# Patient Record
Sex: Male | Born: 1955 | Race: White | Hispanic: No | Marital: Married | Smoking: Never smoker
Health system: Southern US, Community
[De-identification: ages and names within clinical notes are randomized; demographics above are authoritative.]

## PROBLEM LIST (undated history)

## (undated) DIAGNOSIS — G4700 Insomnia, unspecified: Secondary | ICD-10-CM

## (undated) DIAGNOSIS — K635 Polyp of colon: Secondary | ICD-10-CM

## (undated) DIAGNOSIS — G4730 Sleep apnea, unspecified: Secondary | ICD-10-CM

## (undated) DIAGNOSIS — F329 Major depressive disorder, single episode, unspecified: Secondary | ICD-10-CM

## (undated) DIAGNOSIS — M1990 Unspecified osteoarthritis, unspecified site: Secondary | ICD-10-CM

## (undated) DIAGNOSIS — I10 Essential (primary) hypertension: Secondary | ICD-10-CM

## (undated) DIAGNOSIS — F32A Depression, unspecified: Secondary | ICD-10-CM

## (undated) HISTORY — DX: Insomnia, unspecified: G47.00

## (undated) HISTORY — DX: Sleep apnea, unspecified: G47.30

## (undated) HISTORY — PX: ROTATOR CUFF REPAIR: SHX139

## (undated) HISTORY — PX: EYE SURGERY: SHX253

## (undated) HISTORY — DX: Polyp of colon: K63.5

## (undated) HISTORY — DX: Depression, unspecified: F32.A

## (undated) HISTORY — PX: REPLACEMENT TOTAL KNEE: SUR1224

## (undated) HISTORY — DX: Major depressive disorder, single episode, unspecified: F32.9

## (undated) HISTORY — DX: Essential (primary) hypertension: I10

## (undated) HISTORY — PX: WISDOM TOOTH EXTRACTION: SHX21

---

## 2004-09-26 ENCOUNTER — Ambulatory Visit: Payer: Self-pay | Admitting: Family Medicine

## 2004-10-15 ENCOUNTER — Ambulatory Visit: Payer: Self-pay | Admitting: Family Medicine

## 2005-04-02 ENCOUNTER — Ambulatory Visit: Payer: Self-pay | Admitting: Internal Medicine

## 2005-09-08 ENCOUNTER — Ambulatory Visit: Payer: Self-pay | Admitting: Family Medicine

## 2005-10-02 ENCOUNTER — Ambulatory Visit: Payer: Self-pay | Admitting: Family Medicine

## 2006-03-04 ENCOUNTER — Ambulatory Visit: Payer: Self-pay | Admitting: Family Medicine

## 2006-03-16 ENCOUNTER — Ambulatory Visit: Payer: Self-pay | Admitting: Family Medicine

## 2006-06-12 ENCOUNTER — Ambulatory Visit: Payer: Self-pay | Admitting: Family Medicine

## 2006-09-16 ENCOUNTER — Ambulatory Visit: Payer: Self-pay | Admitting: Family Medicine

## 2007-02-23 ENCOUNTER — Ambulatory Visit: Payer: Self-pay | Admitting: Family Medicine

## 2007-03-01 ENCOUNTER — Encounter: Admission: RE | Admit: 2007-03-01 | Discharge: 2007-04-14 | Payer: Self-pay | Admitting: Family Medicine

## 2007-04-27 ENCOUNTER — Telehealth (INDEPENDENT_AMBULATORY_CARE_PROVIDER_SITE_OTHER): Payer: Self-pay | Admitting: *Deleted

## 2007-05-04 ENCOUNTER — Encounter: Admission: RE | Admit: 2007-05-04 | Discharge: 2007-06-03 | Payer: Self-pay | Admitting: Family Medicine

## 2007-05-04 ENCOUNTER — Encounter: Payer: Self-pay | Admitting: Family Medicine

## 2007-05-16 ENCOUNTER — Telehealth: Payer: Self-pay | Admitting: Family Medicine

## 2007-06-03 ENCOUNTER — Encounter: Payer: Self-pay | Admitting: Family Medicine

## 2007-08-16 ENCOUNTER — Ambulatory Visit: Payer: Self-pay | Admitting: Family Medicine

## 2007-08-16 DIAGNOSIS — F329 Major depressive disorder, single episode, unspecified: Secondary | ICD-10-CM | POA: Insufficient documentation

## 2007-08-16 DIAGNOSIS — D649 Anemia, unspecified: Secondary | ICD-10-CM | POA: Insufficient documentation

## 2007-08-16 DIAGNOSIS — F32A Depression, unspecified: Secondary | ICD-10-CM | POA: Insufficient documentation

## 2007-10-31 ENCOUNTER — Inpatient Hospital Stay (HOSPITAL_COMMUNITY): Admission: RE | Admit: 2007-10-31 | Discharge: 2007-11-03 | Payer: Self-pay | Admitting: Orthopedic Surgery

## 2007-11-03 ENCOUNTER — Encounter: Payer: Self-pay | Admitting: Family Medicine

## 2007-12-15 ENCOUNTER — Ambulatory Visit: Payer: Self-pay | Admitting: Family Medicine

## 2007-12-15 DIAGNOSIS — G4700 Insomnia, unspecified: Secondary | ICD-10-CM | POA: Insufficient documentation

## 2007-12-15 DIAGNOSIS — F411 Generalized anxiety disorder: Secondary | ICD-10-CM | POA: Insufficient documentation

## 2008-08-01 ENCOUNTER — Telehealth: Payer: Self-pay | Admitting: Family Medicine

## 2009-01-25 ENCOUNTER — Ambulatory Visit: Payer: Self-pay | Admitting: Family Medicine

## 2009-01-29 LAB — CONVERTED CEMR LAB
ALT: 17 units/L (ref 0–53)
Albumin: 4.3 g/dL (ref 3.5–5.2)
Alkaline Phosphatase: 45 units/L (ref 39–117)
BUN: 20 mg/dL (ref 6–23)
Bilirubin, Direct: 0 mg/dL (ref 0.0–0.3)
Calcium: 9.2 mg/dL (ref 8.4–10.5)
Eosinophils Absolute: 0.2 10*3/uL (ref 0.0–0.7)
GFR calc non Af Amer: 74.89 mL/min (ref >60)
Glucose, Bld: 181 mg/dL — ABNORMAL HIGH (ref 70–99)
HCT: 42.6 % (ref 39.0–52.0)
Lymphocytes Relative: 31.7 % (ref 12.0–46.0)
Lymphs Abs: 1.8 10*3/uL (ref 0.7–4.0)
Monocytes Relative: 8.5 % (ref 3.0–12.0)
Platelets: 207 10*3/uL (ref 150.0–400.0)
RDW: 12.4 % (ref 11.5–14.6)
Testosterone: 314.91 ng/dL — ABNORMAL LOW (ref 350.00–890.00)
Total Protein: 6.9 g/dL (ref 6.0–8.3)

## 2009-03-21 ENCOUNTER — Encounter (INDEPENDENT_AMBULATORY_CARE_PROVIDER_SITE_OTHER): Payer: Self-pay | Admitting: *Deleted

## 2009-04-12 ENCOUNTER — Telehealth: Payer: Self-pay | Admitting: Family Medicine

## 2009-06-26 ENCOUNTER — Telehealth: Payer: Self-pay | Admitting: Family Medicine

## 2009-06-27 ENCOUNTER — Encounter: Payer: Self-pay | Admitting: Family Medicine

## 2009-09-20 ENCOUNTER — Ambulatory Visit: Payer: Self-pay | Admitting: Family Medicine

## 2009-10-08 ENCOUNTER — Ambulatory Visit: Payer: Self-pay | Admitting: Family Medicine

## 2009-10-28 ENCOUNTER — Encounter (INDEPENDENT_AMBULATORY_CARE_PROVIDER_SITE_OTHER): Payer: Self-pay | Admitting: *Deleted

## 2009-10-29 ENCOUNTER — Ambulatory Visit: Payer: Self-pay | Admitting: Internal Medicine

## 2009-12-20 ENCOUNTER — Ambulatory Visit: Payer: Self-pay | Admitting: Family Medicine

## 2009-12-23 ENCOUNTER — Telehealth: Payer: Self-pay | Admitting: Family Medicine

## 2010-01-01 ENCOUNTER — Telehealth: Payer: Self-pay | Admitting: Family Medicine

## 2010-01-07 ENCOUNTER — Telehealth: Payer: Self-pay | Admitting: Family Medicine

## 2010-03-07 ENCOUNTER — Telehealth: Payer: Self-pay | Admitting: Family Medicine

## 2010-09-27 ENCOUNTER — Encounter: Payer: Self-pay | Admitting: Family Medicine

## 2010-10-07 NOTE — Progress Notes (Signed)
Summary: phone  Phone Note Outgoing Call   Call placed by: Raechel Ache, RN,  December 23, 2009 8:46 AM Summary of Call: [facility]  Follow-up for Phone Call        Phone call completed Follow-up by: Raechel Ache, RN,  December 24, 2009 9:18 AM

## 2010-10-07 NOTE — Progress Notes (Signed)
Summary: REQ FOR REFILL  Phone Note Refill Request Message from:  Fax from Pharmacy on March 07, 2010 10:06 AM  Refills Requested: Medication #1:  WELLBUTRIN XL 150 MG XR24H-TAB once daily   Notes: [facility] - [address]. [place].    Initial call taken by: Debbra Riding,  March 07, 2010 10:07 AM    Prescriptions: WELLBUTRIN XL 150 MG XR24H-TAB (BUPROPION HCL) once daily  #30 x 11   Entered by:   Raechel Ache, RN   Authorized by:   Nelwyn Salisbury MD   Signed by:   Raechel Ache, RN on 03/07/2010   Method used:   Electronically to        [facility] (retail)       [address]       [place], [place]  [address]       Ph: [phone] or [phone]       Fax: [phone]   RxID:   [id number]

## 2010-10-07 NOTE — Assessment & Plan Note (Signed)
Summary: STUFFINESS GREEN MUCUS SEPTIC/PS   Vital Signs:  Patient profile:   55 year old male Weight:      185 pounds Temp:     98.5 degrees F oral Pulse rate:   87 / minute BP sitting:   134 / 82  (left arm) Cuff size:   regular  Vitals Entered By: Alfred Levins, CMA (October 08, 2009 4:15 PM) CC: chest congestion, cough x3 wks   History of Present Illness: Here for one week of stuffy head, PND, chesyt congestion, and coughing up yellow mucus. No fever. On Mucinex.  Allergies: No Known Drug Allergies  Past History:  Past Medical History: Reviewed history from 12/15/2007 and no changes required. Anemia-NOS Depression Anxiety  Review of Systems  The patient denies anorexia, fever, weight loss, weight gain, vision loss, decreased hearing, hoarseness, chest pain, syncope, dyspnea on exertion, peripheral edema, headaches, hemoptysis, abdominal pain, melena, hematochezia, severe indigestion/heartburn, hematuria, incontinence, genital sores, muscle weakness, suspicious skin lesions, transient blindness, difficulty walking, depression, unusual weight change, abnormal bleeding, enlarged lymph nodes, angioedema, breast masses, and testicular masses.    Physical Exam  General:  Well-developed,well-nourished,in no acute distress; alert,appropriate and cooperative throughout examination Head:  Normocephalic and atraumatic without obvious abnormalities. No apparent alopecia or balding. Eyes:  No corneal or conjunctival inflammation noted. EOMI. Perrla. Funduscopic exam benign, without hemorrhages, exudates or papilledema. Vision grossly normal. Ears:  External ear exam shows no significant lesions or deformities.  Otoscopic examination reveals clear canals, tympanic membranes are intact bilaterally without bulging, retraction, inflammation or discharge. Hearing is grossly normal bilaterally. Nose:  External nasal examination shows no deformity or inflammation. Nasal mucosa are pink and  moist without lesions or exudates. Mouth:  Oral mucosa and oropharynx without lesions or exudates.  Teeth in good repair. Neck:  No deformities, masses, or tenderness noted. Lungs:  soft rhonchi   Impression & Recommendations:  Problem # 1:  ACUTE BRONCHITIS (ICD-466.0)  His updated medication list for this problem includes:    Zithromax Z-pak 250 Mg Tabs (Azithromycin) .Marland Kitchen... As directed  Complete Medication List: 1)  Multivitamins Tabs (Multiple vitamin) .Marland Kitchen.. 1 by mouth once daily 2)  Mobic 15 Mg Tabs (Meloxicam) .Marland Kitchen.. 1 by mouth once daily 3)  Ativan 0.5 Mg Tabs (Lorazepam) .Marland Kitchen.. 1 or 2 every 6 hours as needed anxiety 4)  Temazepam 30 Mg Caps (Temazepam) .Marland Kitchen.. 1 at bedtime 5)  Wellbutrin Xl 150 Mg Xr24h-tab (Bupropion hcl) .... Once daily 6)  Androgel Pump 1 % Gel (Testosterone) .Marland Kitchen.. 10g every day 7)  Zithromax Z-pak 250 Mg Tabs (Azithromycin) .... As directed  Patient Instructions: 1)  Please schedule a follow-up appointment as needed .  Prescriptions: ZITHROMAX Z-PAK 250 MG TABS (AZITHROMYCIN) as directed  #1 x 0   Entered and Authorized by:   Nelwyn Salisbury MD   Signed by:   Nelwyn Salisbury MD on 10/08/2009   Method used:   Electronically to        [facility] (retail)       [address]       [place], [place]  [address]       Ph: [phone] or [phone]       Fax: [phone]   RxID:   [id number]

## 2010-10-07 NOTE — Letter (Signed)
Summary: [facility] Instructions  [facility]  [address] [place], [place] [address]   Phone: [phone]  Fax: [phone]       PIO EATHERLY    1955-11-05    MRN: [id number]        Procedure Day /Date: Tuesday 11/12/2009     Arrival Time: 10:00 am      Procedure Time: 11:00 am     Location of Procedure:                    _x _  [facility] (4th Floor)                         PREPARATION FOR COLONOSCOPY WITH MOVIPREP   Starting 5 days prior to your procedure Thursday 3/3 do not eat nuts, seeds, popcorn, corn, beans, peas,  salads, or any raw vegetables.  Do not take any fiber supplements (e.g. Metamucil, Citrucel, and Benefiber).  THE DAY BEFORE YOUR PROCEDURE         DATE: Monday 3/7  1.  Drink clear liquids the entire day-NO SOLID FOOD  2.  Do not drink anything colored red or purple.  Avoid juices with pulp.  No orange juice.  3.  Drink at least 64 oz. (8 glasses) of fluid/clear liquids during the day to prevent dehydration and help the prep work efficiently.  CLEAR LIQUIDS INCLUDE: Water Jello Ice Popsicles Tea (sugar ok, no milk/cream) Powdered fruit flavored drinks Coffee (sugar ok, no milk/cream) Gatorade Juice: apple, white grape, white cranberry  Lemonade Clear bullion, consomm, broth Carbonated beverages (any kind) Strained chicken noodle soup Hard Candy                             4.  In the morning, mix first dose of MoviPrep solution:    Empty 1 Pouch A and 1 Pouch B into the disposable container    Add lukewarm drinking water to the top line of the container. Mix to dissolve    Refrigerate (mixed solution should be used within 24 hrs)  5.  Begin drinking the prep at 5:00 p.m. The MoviPrep container is divided by 4 marks.   Every 15 minutes drink the solution down to the next mark (approximately 8 oz) until the full liter is complete.   6.  Follow completed prep with 16 oz of clear liquid of your choice (Nothing  red or purple).  Continue to drink clear liquids until bedtime.  7.  Before going to bed, mix second dose of MoviPrep solution:    Empty 1 Pouch A and 1 Pouch B into the disposable container    Add lukewarm drinking water to the top line of the container. Mix to dissolve    Refrigerate  THE DAY OF YOUR PROCEDURE      DATE: Tuesday 3/8  Beginning at 6:00 am (5 hours before procedure):         1. Every 15 minutes, drink the solution down to the next mark (approx 8 oz) until the full liter is complete.  2. Follow completed prep with 16 oz. of clear liquid of your choice.    3. You may drink clear liquids until 9:00 am (2 HOURS BEFORE PROCEDURE).   MEDICATION INSTRUCTIONS  Unless otherwise instructed, you should take regular prescription medications with a small sip of water   as early as possible the morning of  your procedure.        OTHER INSTRUCTIONS  You will need a responsible adult at least 54 years of age to accompany you and drive you home.   This person must remain in the waiting room during your procedure.  Wear loose fitting clothing that is easily removed.  Leave jewelry and other valuables at home.  However, you may wish to bring a book to read or  an iPod/MP3 player to listen to music as you wait for your procedure to start.  Remove all body piercing jewelry and leave at home.  Total time from sign-in until discharge is approximately 2-3 hours.  You should go home directly after your procedure and rest.  You can resume normal activities the  day after your procedure.  The day of your procedure you should not:   Drive   Make legal decisions   Operate machinery   Drink alcohol   Return to work  You will receive specific instructions about eating, activities and medications before you leave.    The above instructions have been reviewed and explained to me by   Ezra Sites RN  October 29, 2009 4:02 PM   I fully understand and can verbalize  these instructions _____________________________ Date _________

## 2010-10-07 NOTE — Miscellaneous (Signed)
Summary: [facility]  Clinical Lists Changes  Medications: Added new medication of MOVIPREP 100 GM  SOLR (PEG-KCL-NACL-NASULF-NA ASC-C) As per prep instructions. - Signed Rx of MOVIPREP 100 GM  SOLR (PEG-KCL-NACL-NASULF-NA ASC-C) As per prep instructions.;  #1 x 0;  Signed;  Entered by: Ezra Sites RN;  Authorized by: Hilarie Fredrickson MD;  Method used: Electronically to [facility]. [facility] [address], [place], [place], [place]  [address], Ph: [phone] or [phone], Fax: [phone] Observations: Added new observation of NKA: T (10/29/2009 15:39)    Prescriptions: MOVIPREP 100 GM  SOLR (PEG-KCL-NACL-NASULF-NA ASC-C) As per prep instructions.  #1 x 0   Entered by:   Ezra Sites RN   Authorized by:   Hilarie Fredrickson MD   Signed by:   Ezra Sites RN on 10/29/2009   Method used:   Electronically to        [facility] (retail)       [address]       [place], [place]  [address]       Ph: [phone] or [phone]       Fax: [phone]   RxID:   [id number]

## 2010-10-07 NOTE — Assessment & Plan Note (Signed)
Summary: FLU-SHOT // RS  Nurse Visit   Allergies: No Known Drug Allergies  Orders Added: 1)  Admin 1st Vaccine [90471] 2)  Flu Vaccine 67yrs + [16606] Flu Vaccine Consent Questions     Do you have a history of severe allergic reactions to this vaccine? no    Any prior history of allergic reactions to egg and/or gelatin? no    Do you have a sensitivity to the preservative Thimersol? no    Do you have a past history of Guillan-Barre Syndrome? no    Do you currently have an acute febrile illness? no    Have you ever had a severe reaction to latex? no    Vaccine information given and explained to patient? yes    Are you currently pregnant? no    Lot Number:[id number]   Exp Date:03/06/2010   Site Given  Left Deltoid IM   .lbflu

## 2010-10-07 NOTE — Progress Notes (Signed)
Summary: new rx  Phone Note Call from Patient Call back at Home Phone [phone]   Caller: Patient Call For: Christopher Salisbury MD Summary of Call: pt needs new rx to [facility]  lorazapam 0.5mg  Initial call taken by: Heron Sabins,  Jan 07, 2010 9:18 AM  Follow-up for Phone Call        call in #60 with 2 rf Follow-up by: Christopher Salisbury MD,  Jan 07, 2010 1:19 PM    Prescriptions: ATIVAN 0.5 MG  TABS (LORAZEPAM) 1 or 2 every 6 hours as needed anxiety  #60 x 2   Entered by:   Raechel Ache, RN   Authorized by:   Christopher Salisbury MD   Signed by:   Raechel Ache, RN on 01/07/2010   Method used:   Historical   RxID:   [id number]

## 2010-10-07 NOTE — Progress Notes (Signed)
Summary: refill Temazepam  Phone Note Refill Request Message from:  Fax from Pharmacy on January 01, 2010 12:58 PM  Refills Requested: Medication #1:  TEMAZEPAM 30 MG  CAPS 1 at bedtime   Dosage confirmed as above?Dosage Confirmed   Supply Requested: 1 month   Last Refilled: 09/12/2009  Method Requested: Telephone to Pharmacy Initial call taken by: Raechel Ache, RN,  January 01, 2010 12:59 PM [facility]er: [facility]  Follow-up for Phone Call        call in #30 with 5 rf Follow-up by: Nelwyn Salisbury MD,  January 01, 2010 6:17 PM    Prescriptions: TEMAZEPAM 30 MG  CAPS (TEMAZEPAM) 1 at bedtime  #30 x 5   Entered by:   Raechel Ache, RN   Authorized by:   Nelwyn Salisbury MD   Signed by:   Raechel Ache, RN on 01/02/2010   Method used:   Historical   RxID:   [id number]

## 2011-01-20 NOTE — Op Note (Signed)
NAME:  Christopher [name], Christopher [name]                 ACCOUNT NO.:  [id number]   MEDICAL RECORD NO.:  [id number]          PATIENT TYPE:  INP   LOCATION:  1621                         FACILITY:  [facility]   PHYSICIAN:  Ollen Gross, M.D.    DATE OF BIRTH:  Jan 21, 1956   DATE OF PROCEDURE:  10/31/2007  DATE OF DISCHARGE:                               OPERATIVE REPORT   PREOPERATIVE DIAGNOSIS:  Osteoarthritis right knee.   POSTOPERATIVE DIAGNOSIS:  Osteoarthritis right knee.   PROCEDURE:  Right total knee arthroplasty.   SURGEON:  Ollen Gross, M.D.   ASSISTANT:  Alexzandrew L. Perkins, P.A.C.   ANESTHESIA:  Spinal with Duramorph.   ESTIMATED BLOOD LOSS:  Minimal.   DRAINS:  None.   TOURNIQUET TIME:  39 minutes at 300 mmHg.   COMPLICATIONS:  None.   DISPOSITION:  Stable to recovery room.   INDICATIONS FOR PROCEDURE:  The patient is a 55 year old male with end-  stage medial compartment arthritis of the right knee with progressively  worsening pain and dysfunction.  He has failed nonoperative management  and presents for total knee arthroplasty.   DESCRIPTION OF PROCEDURE:  After successful administration of spinal  anesthetic, a tourniquet was placed high on the right thigh and right  lower extremity prepped and draped in the usual sterile fashion.  Extremity was wrapped with Esmarch, knee flexed, and tourniquet inflated  to 300 mmHg.  A midline incision was made with a 10 blade through the  subcutaneous tissue to the level of the extensor mechanism.  A fresh  blade was used to make a medial parapatellar arthrotomy.  Soft tissue  over the proximal medial tibia is subperiosteally elevated to the joint  line with a knife but into the semimembranosus bursa with the Cobb  elevator.  Soft tissue laterally was elevated with attention being paid  to avoid the patellar tendon or the tibial tubercle.  The patella was  subluxed laterally and the knee flexed 90 degrees, ACL and PCL removed.  Drill was  used to create a starting hole in the distal femur and the  canal was thoroughly irrigated.  The 5 degree right valgus alignment  guide was placed at the posterior condyle, rotations marked in a black  pen to remove 10 mm off the distal femur.  Distal femoral resection is  made with an oscillating saw.  Sizing block was placed and size 4 was  the most appropriate.  Rotation is marked off the epicondylar axis.  The  size 4 cutting block was placed and the anterior, posterior, and chamfer  cuts were made.   The tibia subluxed forward and the menisci are removed.  The  extramedullary tibial alignment guide is placed referencing proximally  at the medial aspect of the tibial tubercle and distally along the  second metatarsal axis and tibial crest.  Locks pinned to remove 10 mm  off the nondeficient lateral side.  Tibial resection is made with an  oscillating saw.  Size 4 was the most appropriate tibial component and  the proximal tibia was prepared with modular drill and keel punch for  a  size 4.  Femoral preparation was completed with the anterior condylar  cut.   Size 4 mobile bearing tibial trial, size 4 posterior stabilized femoral  trial, and a 10 mm posterior stabilized rotating platform insert trial  are placed.  With the 10 there was a tiny degree of hyperextension and a  tiny degree of varus and valgus placement.  With the 12.5 which allowed  for full extension with excellent varus and valgus anterior and  posterior balance throughout full range of motion.  The patella was then  everted and thickness measured to be 25 mm.  Freehand resection was  taken to 14 mm, the 38 template was placed, lug holes were drilled,  trial patella is placed and it tracks normally.  Osteophytes are removed  off the posterior femur with the trial in place.  All trials were  removed and the cut bone surfaces are prepared with pulsatile lavage.  Cement is mixed and once ready for implantation, a size 4  mobile bearing  tibial tray, size 4 posterior stabilized femur, and 38 patella are  cemented into place and the patella is held with the clamp.  Trial 12.5  mm insert is placed and the knee held in full extension and all extruded  cement removed.  Once the cement had fully hardened and a trial was  removed and the wound copiously irrigated with saline solution.  FloSeal  was injected into the posterior capsule then the permanent 12.5 mm  posterior stabilized rotating platform insert is placed into the tibial  tray.  FloSeal was then injected in the medial and lateral gutters and  suprapatellar area.  A moist sponge is placed and the tourniquet  released with a total time of 39 minutes.  It was held for two minutes  and then removed.  Minimal bleeding was encountered.  The bleeding which  is encountered is stopped with electrocautery.  The wound was again  copiously irrigated and the extensor mechanism closed with interrupted  #1 PDS.  Flexion against gravity is 140 degrees.  The subcu is closed  with interrupted 2-0 Vicryl.  Subcuticular with running 4-0 Monocryl.  The incision is cleaned and dried, and Steri-Strips and a bulky sterile  dressing applied.  He was then placed into a knee immobilizer, awakened,  and transported to the recovery room in stable condition.      Ollen Gross, M.D.  Electronically Signed     FA/MEDQ  D:  10/31/2007  T:  10/31/2007  Job:  [id number]

## 2011-01-20 NOTE — H&P (Signed)
NAME:  Christopher [name], Christopher [name]                 ACCOUNT NO.:  [id number]   MEDICAL RECORD NO.:  [id number]          PATIENT TYPE:  INP   LOCATION:  NA                           [facility]  [facility]   PHYSICIAN:  Ollen Gross, M.D.    DATE OF BIRTH:  March 26, 1956   DATE OF ADMISSION:  10/31/2007  DATE OF DISCHARGE:                              HISTORY & PHYSICAL   CHIEF COMPLAINT:  Right knee pain.   HISTORY OF PRESENT ILLNESS:  The patient is a 55 year old male who has  seen by Dr. Lequita Halt for ongoing right knee pain.  He was seen in second  opinion in the early portion the year of 2008.  At that time, he was  noted to have some degenerative changes in the medial compartment, and  unfortunately has significant pain and continued problems.  He has  progressive worsening pain and dysfunction.  He has reached the point  where he would like to have something done about it.  Risks and benefits  of the procedure having been discussed, he elects to proceed with  surgery.   ALLERGIES:  NO KNOWN DRUG ALLERGIES.   CURRENT MEDICATIONS:  Mobic.   PAST MEDICAL HISTORY:  Arthritis.   PAST SURGICAL HISTORY:  1. Two meniscal surgeries.  2. Right right rotator cuff repair.  3. Left shoulder labral repair.   FAMILY HISTORY:  Mother with arthritis.   SOCIAL HISTORY:  He is married.  He is an Investment banker, corporate.  Nonsmoker.  Three drinks of wine per week.  Two children.  Wife will be  assisting with care after surgery.   REVIEW OF SYSTEMS:  GENERAL:  No fevers, chills, or night sweats.  NEUROLOGIC:  No seizures, syncope, or paralysis.  RESPIRATORY:  No  shortness of breath, productive cough, or hemoptysis.  CARDIOVASCULAR:  No chest pain, angina, orthopnea.  GI:  No nausea, vomiting, diarrhea,  or constipation.  GU:  No dysuria, hematuria, or discharge.  MUSCULOSKELETAL:  Right knee.   PHYSICAL EXAMINATION:  VITAL SIGNS:  Pulse 64, respirations 12, blood  pressure 120/68.  GENERAL:  A 55 year old white  male well-nourished, well-developed, no  acute distress.  Alert, oriented, and cooperative.  Good historian.  HEENT: Normocephalic, atraumatic.  Pupils round and reactive.  EOMs  intact.  NECK:  Supple.  No bruits.  CHEST:  Clear anterior posterior chest walls.  No rhonchi, rales,  wheezing.  HEART:  Regular rhythm.  No murmur.  S1 and S2 noted.  ABDOMEN:  Soft, flat, nontender.  Bowel sounds present.  RECTAL/BREASTS/GENITALIA:  Not done, not pertinent to present illness.  EXTREMITIES:  Right knee no effusion.  Range of motion from 0 to 110.  Tender more medial than lateral.   IMPRESSION:  Osteoarthritis, right knee.   PLAN:  The patient admitted to [facility] to undergo a right  total knee replacement arthroplasty.  Surgery will be performed by Ollen Gross.      Alexzandrew L. Perkins, P.A.C.      Ollen Gross, M.D.  Electronically Signed    ALP/MEDQ  D:  10/30/2007  T:  10/31/2007  Job:  [id number]   cc:   Ollen Gross, M.D.  Fax: [phone]   Tera Mater. Clent Ridges, MD  [address] [place]  [place] [address]

## 2011-01-20 NOTE — Discharge Summary (Signed)
NAME:  GERAN, HAITHCOCK                 ACCOUNT NO.:  [id number]   MEDICAL RECORD NO.:  [id number]          PATIENT TYPE:  INP   LOCATION:  1621                         [facility]ACILITY:  [facility]   PHYSICIAN:  Ollen Gross, M.D.    DATE OF BIRTH:  29-Dec-1955   DATE OF ADMISSION:  10/31/2007  DATE OF DISCHARGE:  11/03/2007                               DISCHARGE SUMMARY   ADMITTING DIAGNOSIS:  Osteoarthritis, right knee.   DISCHARGE DIAGNOSES:  1. Osteoarthritis, right knee, status post right total knee      arthroplasty.  2. Mild postoperative hyponatremia, improved.   PROCEDURE:  October 31, 2007 - right total knee.  Surgeon - Dr.  Lequita Halt.  Assistant - Avel Peace PA-C.  Anesthesia spinal with  Duramorph.   CONSULTATIONS:  None.   BRIEF HISTORY:  The patient is a 55 year old male with end-stage  arthritis of the right knee with progressive worsening pain and  dysfunction, failed outpatient management, who now presents for total  knee arthroplasty.   LABORATORY DATA:  Preoperative CBC showed a hemoglobin 14.3, hematocrit  40.6, white cell count 4.9, platelets 229.  Postoperative hemoglobin  11.7; came back up to 12.  Last noted hemoglobin and hematocrit of 10.9  and 31.4.  PT/PTT preoperatively of 13.4 and 28 respectively.  INR 1.0.  Serial pro times followed.  Last noted PT/INR 26.7 and 2.4.  Chemistry  panel on admission - slightly elevated CO2 of 33.  Remaining chem panel  within normal limits.  Serial BMETs were followed.  Sodium did drop from  144 to 134 back up to 139.  Preoperative UA negative.  Blood group type  A+.   EKG on October 21, 2007 revealed marked sinus bradycardia, abnormal  EKG, confirmed but Dr. Lady Deutscher.   HOSPITAL COURSE:  The patient was admitted to [facility] and  tolerated the procedure well.  Later transferred to the recovery room  and the orthopedic floor.  Started on PCA and p.o. analgesic pain  control following surgery.  Had a rough night  on the evening of surgery,  doing a little bit better on the morning of day one.  Started getting up  out of bed with therapy.  Foley was pulled.  By day two, doing a little  bit better.  Pain under decent control and from a therapy standpoint  actually walked about 400 feet, doing excellent with mobility.  Dressing  change; incision looked good.  Continued to progress well and was ready  to go home by the following day.   DISCHARGE/PLAN:  1. Was discharged home on November 03, 2007.  2. Discharge diagnoses - please see above.  3. Discharge medications - Percocet, Robaxin, Coumadin.   ACTIVITY:  Weightbearing as tolerated.  Total knee protocol.  Home  health PT, home health nursing.   DIET:  As tolerated.   FOLLOWUP:  Follow up in 2 weeks.   DISPOSITION:  Home.   CONDITION ON DISCHARGE:  Improving.      Alexzandrew L. Perkins, P.A.C.      Ollen Gross, M.D.  Electronically Signed  ALP/MEDQ  D:  12/12/2007  T:  12/12/2007  Job:  [id number]   cc:   Jeannett Senior A. Clent Ridges, MD  [address] [place]  [place] [address]

## 2011-01-23 NOTE — Assessment & Plan Note (Signed)
[facility]                                 ON-CALL NOTE   NAME:Christopher [name], Christopher [name]                          MRN:          [id number]  DATE:11/05/2007                            DOB:          09/02/1956    TIME OF CALL:  11:35 a.m.   PHONE NUMBER:  [phone] or [phone].   CALLER:  Tedra Coupe.   OBJECTIVE:  No bowel movements since knee replacement surgery on Monday.  He is uncomfortable and complains of rash on his back.  A [facility] nurse  suggested an enema, but it has not helped.  They tried prune juice,  apple juice, and it has not helped either.   ASSESSMENT:  Constipation probably resulting from narcotic medication.   PLAN:  Suggested they try milk of magnesia 30 mL p.o. now.  May repeat  in 4 hours if no production.  To the ER if his symptoms worsen.   PRIMARY CARE [place]:  Dr. Clent Ridges.  Home office is [place].     Arta Silence, MD  Electronically Signed    RNS/MedQ  DD: 11/05/2007  DT: 11/06/2007  Job #: [id number]

## 2011-03-02 ENCOUNTER — Other Ambulatory Visit: Payer: Self-pay | Admitting: Family Medicine

## 2011-03-02 NOTE — Telephone Encounter (Signed)
rx request for temazepam 30 #30 pls advise

## 2011-03-03 NOTE — Telephone Encounter (Signed)
duplicate

## 2011-03-03 NOTE — Telephone Encounter (Signed)
Call in #30 with 5 rf 

## 2011-03-19 ENCOUNTER — Other Ambulatory Visit: Payer: Self-pay | Admitting: Family Medicine

## 2011-03-20 NOTE — Telephone Encounter (Signed)
Looks like pt was last here on 10/08/09 and script last filled on 01/29/11.

## 2011-03-24 ENCOUNTER — Other Ambulatory Visit: Payer: Self-pay

## 2011-03-24 NOTE — Telephone Encounter (Signed)
He needs an OV before we can do any refills

## 2011-03-24 NOTE — Telephone Encounter (Signed)
Last filled 01/29/2011. Last ov/acute 10/08/2009. Last med check 01/25/2009

## 2011-03-25 NOTE — Telephone Encounter (Signed)
Pharmacy notified. Left message for pt to schedule OV

## 2011-03-27 ENCOUNTER — Ambulatory Visit (INDEPENDENT_AMBULATORY_CARE_PROVIDER_SITE_OTHER): Payer: BC Managed Care – PPO | Admitting: Family Medicine

## 2011-03-27 ENCOUNTER — Encounter: Payer: Self-pay | Admitting: Family Medicine

## 2011-03-27 VITALS — BP 122/80 | HR 67 | Temp 98.6°F | Wt 180.0 lb

## 2011-03-27 DIAGNOSIS — E291 Testicular hypofunction: Secondary | ICD-10-CM

## 2011-03-27 MED ORDER — TESTOSTERONE 20.25 MG/ACT (1.62%) TD GEL: 4.0000 | Freq: Every day | TRANSDERMAL | Status: DC

## 2011-03-30 ENCOUNTER — Telehealth: Payer: Self-pay | Admitting: Family Medicine

## 2011-03-30 ENCOUNTER — Encounter: Payer: Self-pay | Admitting: Family Medicine

## 2011-03-30 NOTE — Progress Notes (Signed)
  Subjective:    Patient ID: Christopher [name], male    DOB: 1956/03/09, 55 y.o.   MRN: [id number]  HPI Here to discuss low testosterone. He has been pleased with Androgel and wants to stay on this. He feels better and has better libido.    Review of Systems  Constitutional: Negative.   Respiratory: Negative.   Cardiovascular: Negative.   Gastrointestinal: Negative.   Genitourinary: Negative.        Objective:   Physical Exam  Constitutional: He appears well-developed and well-nourished.  Cardiovascular: Normal rate, regular rhythm, normal heart sounds and intact distal pulses.   Pulmonary/Chest: Effort normal and breath sounds normal.          Assessment & Plan:  Check another level and refills are written

## 2011-03-30 NOTE — Telephone Encounter (Signed)
Pt was written a prescription for Testosterone (ANDROGEL PUMP) 20.25 MG/ACT (1.62%) on Friday. Percentage of meds was changed and pharmacy will not fill, need authorization from insurance.

## 2011-03-30 NOTE — Telephone Encounter (Signed)
Pt. Normally has prescriptions filled through [facility] please change to [facility].

## 2011-03-31 NOTE — Telephone Encounter (Signed)
Pt called to state that insurance is requiring pa for angrogel and it may be due to the dosage change.  If that is the case pt is ok with going back to the old dosage to avoid pa.

## 2011-04-01 ENCOUNTER — Telehealth: Payer: Self-pay | Admitting: Family Medicine

## 2011-04-01 DIAGNOSIS — E291 Testicular hypofunction: Secondary | ICD-10-CM

## 2011-04-01 NOTE — Telephone Encounter (Signed)
Message copied by Baldemar Friday on Wed Apr 01, 2011  8:53 AM ------      Message from: Christopher [name]      Created: Mon Mar 30, 2011  1:19 PM       His level is quite low. Use the new prescription and recheck [name] level in 6 months

## 2011-04-01 NOTE — Telephone Encounter (Signed)
Change his pharmacy to [facility]

## 2011-04-01 NOTE — Telephone Encounter (Signed)
Pharmacy was changed in computer and other issues were covered in last phone note.

## 2011-04-01 NOTE — Telephone Encounter (Signed)
I spoke with pt, gave lab results, put a future lab order in computer and prior authorization for Andro Gel was faxed on 03/31/11.

## 2011-04-03 ENCOUNTER — Telehealth: Payer: Self-pay | Admitting: Family Medicine

## 2011-04-03 NOTE — Telephone Encounter (Signed)
Tell him to go ahead and use 8 pumps a day of the Androgel. Also call in a new rx for 8 pumps a day for one month plus 5 rf

## 2011-04-03 NOTE — Telephone Encounter (Signed)
Left message on machine for patient  To return our call 

## 2011-04-03 NOTE — Telephone Encounter (Signed)
Pt stated that he picked up new prescription and he believes that this dose is not stronger than what he was taking. Please explain?

## 2011-04-06 NOTE — Telephone Encounter (Signed)
Called pharmacy with new directions and refills, left message for pt with new info.

## 2011-04-08 ENCOUNTER — Telehealth: Payer: Self-pay | Admitting: Family Medicine

## 2011-04-08 NOTE — Telephone Encounter (Signed)
Pt is changing from pharmacy in [place] to [facility] and we do have the new one in computer.

## 2011-04-15 ENCOUNTER — Telehealth: Payer: Self-pay | Admitting: Family Medicine

## 2011-04-15 MED ORDER — TESTOSTERONE 20.25 MG/ACT (1.62%) TD GEL: 8.0000 | Freq: Every day | TRANSDERMAL | Status: DC

## 2011-04-15 NOTE — Telephone Encounter (Signed)
Pt requested new script called in for Androgel. I did and pt is aware.

## 2011-05-29 LAB — COMPREHENSIVE METABOLIC PANEL
Albumin: 4.1
BUN: 23
Chloride: 106
Creatinine, Ser: 0.89
GFR calc non Af Amer: >60
Total Bilirubin: 1

## 2011-05-29 LAB — PROTIME-INR
INR: 1
INR: 1.2
INR: 1.7 — ABNORMAL HIGH
INR: 2.4 — ABNORMAL HIGH
Prothrombin Time: 13.4
Prothrombin Time: 20.9 — ABNORMAL HIGH

## 2011-05-29 LAB — CBC
HCT: 40.6
HCT: 31.4 — ABNORMAL LOW
HCT: 33.6 — ABNORMAL LOW
Hemoglobin: 10.9 — ABNORMAL LOW
Hemoglobin: 12 — ABNORMAL LOW
MCV: 89.9
Platelets: 229
Platelets: 202
RBC: 3.43 — ABNORMAL LOW
RBC: 3.76 — ABNORMAL LOW
RDW: 13.4
RDW: 13.4
WBC: 4.9
WBC: 6.6
WBC: 8
WBC: 9.3

## 2011-05-29 LAB — BASIC METABOLIC PANEL
BUN: 16
Calcium: 8.6
Calcium: 9.1
Creatinine, Ser: 0.98
GFR calc Af Amer: >60
GFR calc non Af Amer: >60
GFR calc non Af Amer: >60
Glucose, Bld: 146 — ABNORMAL HIGH
Potassium: 3.9
Sodium: 134 — ABNORMAL LOW
Sodium: 139

## 2011-05-29 LAB — TYPE AND SCREEN: Antibody Screen: NEGATIVE

## 2011-05-29 LAB — URINALYSIS, ROUTINE W REFLEX MICROSCOPIC
Bilirubin Urine: NEGATIVE
Glucose, UA: NEGATIVE
Ketones, ur: NEGATIVE
Nitrite: NEGATIVE
Specific Gravity, Urine: 1.008
pH: 6.5

## 2011-05-29 LAB — ABO/RH: ABO/RH(D): A POS

## 2011-05-29 LAB — APTT: aPTT: 28

## 2011-06-09 ENCOUNTER — Telehealth: Payer: Self-pay | Admitting: Family Medicine

## 2011-06-09 NOTE — Telephone Encounter (Signed)
Pt called and needs to know when he had his last tetanus booster? Pls call.

## 2011-06-10 ENCOUNTER — Ambulatory Visit: Payer: BC Managed Care – PPO | Admitting: Family Medicine

## 2011-06-10 NOTE — Telephone Encounter (Signed)
Called to make pt aware that we do not have a tetanus on file. Left a message for pt to return call.

## 2011-06-10 NOTE — Telephone Encounter (Signed)
Pt is on our schedule for 06/10/11.

## 2011-06-17 ENCOUNTER — Telehealth: Payer: Self-pay | Admitting: *Deleted

## 2011-06-17 NOTE — Telephone Encounter (Signed)
Pt is askng for a stronger NSAID than Ibuprofen for low back pain.  He is seeing PT, but the Ibuprofen does not last long enough.

## 2011-06-18 MED ORDER — DICLOFENAC SODIUM 75 MG PO TBEC: 75.0000 mg | DELAYED_RELEASE_TABLET | Freq: Two times a day (BID) | ORAL | Status: DC

## 2011-06-18 NOTE — Telephone Encounter (Signed)
Pharmacy will call pt when ready to pick up.

## 2011-06-18 NOTE — Telephone Encounter (Signed)
Call in Diclofenac 75 mg bid prn pain, #60 with 11 rf 

## 2011-08-13 ENCOUNTER — Other Ambulatory Visit (INDEPENDENT_AMBULATORY_CARE_PROVIDER_SITE_OTHER): Payer: BC Managed Care – PPO

## 2011-08-13 DIAGNOSIS — E291 Testicular hypofunction: Secondary | ICD-10-CM

## 2011-08-13 LAB — TESTOSTERONE: Testosterone: 1301.5 ng/dL — ABNORMAL HIGH (ref 350.00–890.00)

## 2011-08-17 NOTE — Progress Notes (Signed)
Quick Note:  Left voice message ______ 

## 2011-08-18 ENCOUNTER — Telehealth: Payer: Self-pay | Admitting: Family Medicine

## 2011-08-18 NOTE — Telephone Encounter (Signed)
Pt left a voice message, he might have a upper respiratory infection and has been coughing up yellowish sputum. Can we call something in or does he need to be seen? I spoke with pt and he will schedule a office visit.

## 2011-08-19 ENCOUNTER — Encounter: Payer: Self-pay | Admitting: Family Medicine

## 2011-08-19 ENCOUNTER — Ambulatory Visit (INDEPENDENT_AMBULATORY_CARE_PROVIDER_SITE_OTHER): Payer: BC Managed Care – PPO | Admitting: Family Medicine

## 2011-08-19 VITALS — BP 150/90 | HR 76 | Temp 99.3°F | Wt 182.0 lb

## 2011-08-19 DIAGNOSIS — J40 Bronchitis, not specified as acute or chronic: Secondary | ICD-10-CM

## 2011-08-19 MED ORDER — AZITHROMYCIN 250 MG PO TABS: ORAL_TABLET | ORAL | Status: AC

## 2011-08-20 ENCOUNTER — Encounter: Payer: Self-pay | Admitting: Family Medicine

## 2011-08-20 NOTE — Progress Notes (Signed)
  Subjective:    Patient ID: Christopher [name], male    DOB: May 15, 1956, 55 y.o.   MRN: [id number]  HPI Here for one week of fevers, body aches, ST, chest tightness and coughing up green sputum. On Mucinex   Review of Systems  Constitutional: Positive for fever.  HENT: Positive for congestion and postnasal drip.   Eyes: Negative.   Respiratory: Positive for cough.        Objective:   Physical Exam  Constitutional: He appears well-developed and well-nourished.  HENT:  Right Ear: External ear normal.  Left Ear: External ear normal.  Nose: Nose normal.  Mouth/Throat: Oropharynx is clear and moist. No oropharyngeal exudate.  Eyes: Conjunctivae are normal.  Pulmonary/Chest: Effort normal. He has no wheezes. He has no rales.       Scattered rhonchi  Lymphadenopathy:    He has no cervical adenopathy.          Assessment & Plan:  Recheck prn

## 2011-09-05 ENCOUNTER — Other Ambulatory Visit: Payer: Self-pay | Admitting: Family Medicine

## 2011-09-09 NOTE — Telephone Encounter (Signed)
Call in Androgel 450 grams with 5 rf, also Temazepam #30 with 5 rf

## 2011-12-23 ENCOUNTER — Ambulatory Visit: Payer: BC Managed Care – PPO | Admitting: Family Medicine

## 2011-12-23 DIAGNOSIS — Z0289 Encounter for other administrative examinations: Secondary | ICD-10-CM

## 2012-03-01 ENCOUNTER — Ambulatory Visit (INDEPENDENT_AMBULATORY_CARE_PROVIDER_SITE_OTHER): Payer: BC Managed Care – PPO | Admitting: Family Medicine

## 2012-03-01 ENCOUNTER — Encounter: Payer: Self-pay | Admitting: Family Medicine

## 2012-03-01 VITALS — BP 134/90 | HR 66 | Temp 98.2°F | Wt 178.0 lb

## 2012-03-01 DIAGNOSIS — I10 Essential (primary) hypertension: Secondary | ICD-10-CM

## 2012-03-01 MED ORDER — LOSARTAN POTASSIUM 50 MG PO TABS: 50.0000 mg | ORAL_TABLET | Freq: Every day | ORAL | Status: DC

## 2012-03-01 NOTE — Progress Notes (Signed)
  Subjective:    Patient ID: Christopher [name], male    DOB: May 12, 1956, 56 y.o.   MRN: [id number]  HPI Here for elevated BPs. He has a family hx of HTN both in his father and his brother. He has been getting systolic readings of 150-170 and diastolic of 90-100 at home for the past month. The BP then settles down later in the day. No symptoms, no HA or SOB or chest pains. He exercises and he eats a healthy diet. No tobacco use.    Review of Systems  Constitutional: Negative.   Respiratory: Negative.   Cardiovascular: Negative.        Objective:   Physical Exam  Constitutional: He appears well-developed and well-nourished.  Cardiovascular: Normal rate, regular rhythm, normal heart sounds and intact distal pulses.   Pulmonary/Chest: Effort normal and breath sounds normal.  Musculoskeletal: He exhibits no edema.          Assessment & Plan:  Treat with Losartan. He will return in 3 weeks to follow up, and we will do a cpx and get fasting labs at that time.

## 2012-03-04 ENCOUNTER — Other Ambulatory Visit (INDEPENDENT_AMBULATORY_CARE_PROVIDER_SITE_OTHER): Payer: BC Managed Care – PPO

## 2012-03-04 DIAGNOSIS — Z0000 Encounter for general adult medical examination without abnormal findings: Secondary | ICD-10-CM

## 2012-03-04 LAB — CBC WITH DIFFERENTIAL/PLATELET
Basophils Relative: 0.5 % (ref 0.0–3.0)
Eosinophils Relative: 2.5 % (ref 0.0–5.0)
Hemoglobin: 15.2 g/dL (ref 13.0–17.0)
MCV: 94.5 fl (ref 78.0–100.0)
Monocytes Absolute: 0.6 10*3/uL (ref 0.1–1.0)
Neutro Abs: 3.4 10*3/uL (ref 1.4–7.7)
Neutrophils Relative %: 56 % (ref 43.0–77.0)
RBC: 4.81 Mil/uL (ref 4.22–5.81)
WBC: 6.1 10*3/uL (ref 4.5–10.5)

## 2012-03-04 LAB — BASIC METABOLIC PANEL
Chloride: 104 mEq/L (ref 96–112)
Creatinine, Ser: 0.8 mg/dL (ref 0.4–1.5)
Potassium: 4.1 mEq/L (ref 3.5–5.1)
Sodium: 137 mEq/L (ref 135–145)

## 2012-03-04 LAB — POCT URINALYSIS DIPSTICK
Bilirubin, UA: NEGATIVE
Glucose, UA: NEGATIVE
Leukocytes, UA: NEGATIVE
Nitrite, UA: NEGATIVE

## 2012-03-04 LAB — LIPID PANEL: Total CHOL/HDL Ratio: 3

## 2012-03-04 LAB — TSH: TSH: 2.63 u[IU]/mL (ref 0.35–5.50)

## 2012-03-04 LAB — HEPATIC FUNCTION PANEL
ALT: 20 U/L (ref 0–53)
Alkaline Phosphatase: 41 U/L (ref 39–117)
Bilirubin, Direct: 0.2 mg/dL (ref 0.0–0.3)
Total Protein: 6.3 g/dL (ref 6.0–8.3)

## 2012-03-04 LAB — PSA: PSA: 1.16 ng/mL (ref 0.10–4.00)

## 2012-03-07 NOTE — Progress Notes (Signed)
Quick Note:  I left voice message with results. ______ 

## 2012-03-08 ENCOUNTER — Telehealth: Payer: Self-pay | Admitting: Family Medicine

## 2012-03-08 NOTE — Telephone Encounter (Signed)
Pt called req to get copy of lab results mailed to his home address.

## 2012-03-11 NOTE — Telephone Encounter (Signed)
Copy mailed to home address 

## 2012-03-25 ENCOUNTER — Encounter: Payer: BC Managed Care – PPO | Admitting: Family Medicine

## 2012-04-03 ENCOUNTER — Other Ambulatory Visit: Payer: Self-pay | Admitting: Family Medicine

## 2012-04-05 ENCOUNTER — Telehealth: Payer: Self-pay | Admitting: Family Medicine

## 2012-04-05 NOTE — Telephone Encounter (Signed)
Call in #30 with 5 rf 

## 2012-04-05 NOTE — Telephone Encounter (Signed)
Refill request for Temazepam 30 mg take 1 po qhs prn and last here 03/01/12.

## 2012-04-06 MED ORDER — TEMAZEPAM 30 MG PO CAPS: 30.0000 mg | ORAL_CAPSULE | Freq: Every evening | ORAL | Status: DC | PRN

## 2012-04-06 NOTE — Telephone Encounter (Signed)
I called in script 

## 2012-04-18 ENCOUNTER — Other Ambulatory Visit: Payer: Self-pay

## 2012-04-18 MED ORDER — TESTOSTERONE 20.25 MG/ACT (1.62%) TD GEL: 8.0000 | Freq: Every day | TRANSDERMAL | Status: DC

## 2012-06-13 ENCOUNTER — Other Ambulatory Visit: Payer: Self-pay | Admitting: Family Medicine

## 2012-06-22 ENCOUNTER — Encounter: Payer: Self-pay | Admitting: Family Medicine

## 2012-06-22 ENCOUNTER — Ambulatory Visit (INDEPENDENT_AMBULATORY_CARE_PROVIDER_SITE_OTHER): Payer: BC Managed Care – PPO | Admitting: Family Medicine

## 2012-06-22 VITALS — BP 122/78 | HR 67 | Temp 98.5°F | Wt 177.0 lb

## 2012-06-22 DIAGNOSIS — I10 Essential (primary) hypertension: Secondary | ICD-10-CM

## 2012-06-22 DIAGNOSIS — F329 Major depressive disorder, single episode, unspecified: Secondary | ICD-10-CM

## 2012-06-22 DIAGNOSIS — F3289 Other specified depressive episodes: Secondary | ICD-10-CM

## 2012-06-22 MED ORDER — BUPROPION HCL ER (XL) 150 MG PO TB24: 150.0000 mg | ORAL_TABLET | Freq: Every day | ORAL | Status: DC

## 2012-06-22 NOTE — Progress Notes (Signed)
  Subjective:    Patient ID: WEI UEHARA, male    DOB: 1956/04/17, 56 y.o.   MRN: [id number]  HPI Here to check his BP and to discuss depression. He had tried Wellbutrin briefly a few years ago but he can't remember if it helped or not. He feels sad often and unmotivated. No crying spells. He has trouble sleeping but appetite is good.    Review of Systems  Constitutional: Negative.   Respiratory: Negative.   Cardiovascular: Negative.   Psychiatric/Behavioral: Negative.        Objective:   Physical Exam  Constitutional: He appears well-developed and well-nourished.  Cardiovascular: Normal rate, regular rhythm, normal heart sounds and intact distal pulses.   Pulmonary/Chest: Effort normal and breath sounds normal.  Psychiatric: He has a normal mood and affect. His behavior is normal. Thought content normal.          Assessment & Plan:  His HTN is stable. Get back on Wellbutrin XL and follow up in one month.

## 2012-08-03 ENCOUNTER — Encounter: Payer: Self-pay | Admitting: Internal Medicine

## 2012-09-14 ENCOUNTER — Ambulatory Visit (AMBULATORY_SURGERY_CENTER): Payer: BC Managed Care – PPO

## 2012-09-14 ENCOUNTER — Encounter: Payer: Self-pay | Admitting: Internal Medicine

## 2012-09-14 VITALS — Ht 72.0 in | Wt 180.0 lb

## 2012-09-14 DIAGNOSIS — Z1211 Encounter for screening for malignant neoplasm of colon: Secondary | ICD-10-CM

## 2012-09-14 MED ORDER — MOVIPREP 100 G PO SOLR: 1.0000 | Freq: Once | ORAL | Status: DC

## 2012-09-26 ENCOUNTER — Encounter: Payer: Self-pay | Admitting: Internal Medicine

## 2012-09-26 ENCOUNTER — Ambulatory Visit (AMBULATORY_SURGERY_CENTER): Payer: BC Managed Care – PPO | Admitting: Internal Medicine

## 2012-09-26 VITALS — BP 142/81 | HR 65 | Temp 97.6°F | Resp 13 | Ht 72.0 in | Wt 180.0 lb

## 2012-09-26 DIAGNOSIS — D126 Benign neoplasm of colon, unspecified: Secondary | ICD-10-CM

## 2012-09-26 DIAGNOSIS — Z1211 Encounter for screening for malignant neoplasm of colon: Secondary | ICD-10-CM

## 2012-09-26 DIAGNOSIS — K633 Ulcer of intestine: Secondary | ICD-10-CM

## 2012-09-26 DIAGNOSIS — K635 Polyp of colon: Secondary | ICD-10-CM

## 2012-09-26 HISTORY — PX: COLONOSCOPY: SHX174

## 2012-09-26 HISTORY — DX: Polyp of colon: K63.5

## 2012-09-26 MED ORDER — SODIUM CHLORIDE 0.9 % IV SOLN: 500.0000 mL | INTRAVENOUS | Status: DC

## 2012-09-26 NOTE — Progress Notes (Signed)
Called to room to assist during endoscopic procedure.  Patient ID and intended procedure confirmed with present staff. Received instructions for my participation in the procedure from the performing physician.  

## 2012-09-26 NOTE — Op Note (Signed)
[facility] [address] [place], [address]   COLONOSCOPY PROCEDURE REPORT  PATIENT: Stanlee, Roehrig.  MR#: [id number] BIRTHDATE: 06-Dec-1955 , 56  yrs. old GENDER: Male ENDOSCOPIST: Roxy Cedar, MD REFERRED XB:[id number] Program Recall PROCEDURE DATE:  09/26/2012 PROCEDURE:   Colonoscopy with snare polypectomy x 2 and Colonoscopy with biopsies ASA CLASS:   Class II INDICATIONS:Patient's personal history of colon polyps.  Last exam 04-2004 w/ small polyps MEDICATIONS: MAC sedation, administered by CRNA and propofol (Diprivan) 550mg  IV  DESCRIPTION OF PROCEDURE:   After the risks benefits and alternatives of the procedure were thoroughly explained, informed consent was obtained.  A digital rectal exam revealed no abnormalities of the rectum.   The LB PCF-H180AL [id number]  endoscope was introduced through the anus and advanced to the cecum, which was identified by both the appendix and ileocecal valve. No adverse events experienced.   The quality of the prep was adequate, using MoviPrep  The instrument was then slowly withdrawn as the colon was fully examined.   COLON FINDINGS: A diminutive polyp was found in the ascending colon. A polypectomy was performed with a cold snare.  The resection was complete and the polyp tissue was completely retrieved.   A pedunculated polyp measuring 8 mm in size was found in the sigmoid colon.  A polypectomy was performed using snare cautery.  The resection was complete and the polyp tissue was completely retrieved.   A 1cm ulcer was found in the ascending colon. Multiple biopsies were performed.   Mild diverticulosis was noted in the sigmoid colon.   The colon mucosa was otherwise normal. Retroflexed views revealed internal hemorrhoids. The time to cecum=4 minutes 46 seconds.  Withdrawal time=21 minutes 16 seconds. The scope was withdrawn and the procedure completed. COMPLICATIONS: There were no complications.  ENDOSCOPIC  IMPRESSION: 1.   Diminutive polyp was found in the ascending colon; polypectomy was performed with a cold snare 2.   Pedunculated polyp measuring 8 mm in size was found in the sigmoid colon; polypectomy was performed using snare cautery 3.   Ulcer in the ascending colon; multiple biopsies were performed 4.   Mild diverticulosis was noted in the sigmoid colon 5.   The colon mucosa was otherwise normal  RECOMMENDATIONS: 1.  Await biopsy results 2.  Await pathology results 3. Follow up colonoscopy in 5 years (likely)  eSigned:  Roxy Cedar, MD 09/26/2012 2:55 PM   cc: The Patient and Nelwyn Salisbury, MD   PATIENT NAME:  Dmarco, Baldus. MR#: [id number]

## 2012-09-26 NOTE — Progress Notes (Signed)
Patient did not experience any of the following events: a burn prior to discharge; a fall within the facility; wrong site/side/patient/procedure/implant event; or a hospital transfer or hospital admission upon discharge from the facility. (G8907) Patient did not have preoperative order for IV antibiotic SSI prophylaxis. (G8918)  

## 2012-09-26 NOTE — Patient Instructions (Signed)
YOU HAD AN ENDOSCOPIC PROCEDURE TODAY AT THE [facility]: Refer to the procedure report that was given to you for any specific questions about what was found during the examination.  If the procedure report does not answer your questions, please call your gastroenterologist to clarify.  If you requested that your care partner not be given the details of your procedure findings, then the procedure report has been included in a sealed envelope for you to review at your convenience later.  YOU SHOULD EXPECT: Some feelings of bloating in the abdomen. Passage of more gas than usual.  Walking can help get rid of the air that was put into your GI tract during the procedure and reduce the bloating. If you had a lower endoscopy (such as a colonoscopy or flexible sigmoidoscopy) you may notice spotting of blood in your stool or on the toilet paper. If you underwent a bowel prep for your procedure, then you may not have a normal bowel movement for a few days.  DIET: Your first meal following the procedure should be a light meal and then it is ok to progress to your normal diet.  A half-sandwich or bowl of soup is an example of a good first meal.  Heavy or fried foods are harder to digest and may make you feel nauseous or bloated.  Likewise meals heavy in dairy and vegetables can cause extra gas to form and this can also increase the bloating.  Drink plenty of fluids but you should avoid alcoholic beverages for 24 hours.  ACTIVITY: Your care partner should take you home directly after the procedure.  You should plan to take it easy, moving slowly for the rest of the day.  You can resume normal activity the day after the procedure however you should NOT DRIVE or use heavy machinery for 24 hours (because of the sedation medicines used during the test).    SYMPTOMS TO REPORT IMMEDIATELY: A gastroenterologist can be reached at any hour.  During normal business hours, 8:30 AM to 5:00 PM Monday through Friday,  call [phone].  After hours and on weekends, please call the GI answering service at [phone] who will take a message and have the physician on call contact you.   Following lower endoscopy (colonoscopy or flexible sigmoidoscopy):  Excessive amounts of blood in the stool  Significant tenderness or worsening of abdominal pains  Swelling of the abdomen that is new, acute  Fever of 100F or higher    FOLLOW UP: If any biopsies were taken you will be contacted by phone or by letter within the next 1-3 weeks.  Call your gastroenterologist if you have not heard about the biopsies in 3 weeks.  Our staff will call the home number listed on your records the next business day following your procedure to check on you and address any questions or concerns that you may have at that time regarding the information given to you following your procedure. This is a courtesy call and so if there is no answer at the home number and we have not heard from you through the emergency physician on call, we will assume that you have returned to your regular daily activities without incident.  SIGNATURES/CONFIDENTIALITY: You and/or your care partner have signed paperwork which will be entered into your electronic medical record.  These signatures attest to the fact that that the information above on your After Visit Summary has been reviewed and is understood.  Full responsibility of the confidentiality   of this discharge information lies with you and/or your care-partner.    Information on polyps given to you today 

## 2012-09-27 ENCOUNTER — Telehealth: Payer: Self-pay | Admitting: *Deleted

## 2012-09-27 NOTE — Telephone Encounter (Signed)
F/u callback-left message

## 2012-09-28 ENCOUNTER — Encounter: Payer: BC Managed Care – PPO | Admitting: Internal Medicine

## 2012-10-03 ENCOUNTER — Encounter: Payer: Self-pay | Admitting: Internal Medicine

## 2012-10-04 ENCOUNTER — Telehealth: Payer: Self-pay | Admitting: Family Medicine

## 2012-10-04 MED ORDER — TEMAZEPAM 30 MG PO CAPS: 30.0000 mg | ORAL_CAPSULE | Freq: Every evening | ORAL | Status: DC | PRN

## 2012-10-04 NOTE — Telephone Encounter (Signed)
Refill request for Temazepam 30 mg take 1 po qhs prn and last here on 06/22/12.

## 2012-10-04 NOTE — Telephone Encounter (Signed)
Call in #30 with 5 rf 

## 2012-10-04 NOTE — Telephone Encounter (Signed)
Rx called in to pharmacy. 

## 2012-10-12 ENCOUNTER — Telehealth: Payer: Self-pay | Admitting: Internal Medicine

## 2012-10-12 NOTE — Telephone Encounter (Signed)
Discussed path results and letter with pt. Mailed out another letter to pt.

## 2012-10-18 ENCOUNTER — Ambulatory Visit: Payer: BC Managed Care – PPO | Admitting: Family Medicine

## 2012-10-19 ENCOUNTER — Ambulatory Visit (INDEPENDENT_AMBULATORY_CARE_PROVIDER_SITE_OTHER): Payer: BC Managed Care – PPO | Admitting: Family Medicine

## 2012-10-19 ENCOUNTER — Encounter: Payer: Self-pay | Admitting: Family Medicine

## 2012-10-19 VITALS — BP 148/86 | HR 69 | Temp 98.0°F | Wt 180.0 lb

## 2012-10-19 DIAGNOSIS — F3289 Other specified depressive episodes: Secondary | ICD-10-CM

## 2012-10-19 DIAGNOSIS — I10 Essential (primary) hypertension: Secondary | ICD-10-CM

## 2012-10-19 DIAGNOSIS — F329 Major depressive disorder, single episode, unspecified: Secondary | ICD-10-CM

## 2012-10-19 MED ORDER — LOSARTAN POTASSIUM-HCTZ 100-25 MG PO TABS: 1.0000 | ORAL_TABLET | Freq: Every day | ORAL | Status: DC

## 2012-10-19 NOTE — Progress Notes (Signed)
  Subjective:    Patient ID: Christopher [name], male    DOB: 1956-07-26, 57 y.o.   MRN: [id number]  HPI Here to follow up a few issues. First he is worried about his BP. Over the past few months this has steadily gone up to where he gets a lot of readings in the 160s or 170s systolic and up to 100 diastolic. He feels fine in general. He is very pleased with Wellbutrin, and he feels happier and less stessed than before.    Review of Systems  Constitutional: Negative.   Respiratory: Negative.   Cardiovascular: Negative.   Psychiatric/Behavioral: Negative.        Objective:   Physical Exam  Constitutional: He appears well-developed and well-nourished.  Cardiovascular: Normal rate, regular rhythm, normal heart sounds and intact distal pulses.   Pulmonary/Chest: Effort normal and breath sounds normal.  Psychiatric: He has a normal mood and affect. His behavior is normal. Thought content normal.          Assessment & Plan:  His depression is stable. We will switch from Losartan to Losartan HCT. Recheck in one month

## 2012-10-20 ENCOUNTER — Ambulatory Visit: Payer: BC Managed Care – PPO | Admitting: Family Medicine

## 2012-10-31 ENCOUNTER — Telehealth: Payer: Self-pay | Admitting: Family Medicine

## 2012-10-31 NOTE — Telephone Encounter (Signed)
Call in a 6 month supply  

## 2012-10-31 NOTE — Telephone Encounter (Signed)
Refill request for Androgel pump 1.62 %

## 2012-11-01 MED ORDER — TESTOSTERONE 20.25 MG/ACT (1.62%) TD GEL: 8.0000 "application " | Freq: Every day | TRANSDERMAL | Status: DC

## 2012-11-01 NOTE — Telephone Encounter (Signed)
I called in script 

## 2012-11-15 ENCOUNTER — Telehealth: Payer: Self-pay | Admitting: Family Medicine

## 2012-11-15 MED ORDER — LORAZEPAM 0.5 MG PO TABS: 0.5000 mg | ORAL_TABLET | Freq: Three times a day (TID) | ORAL | Status: DC | PRN

## 2012-11-15 NOTE — Telephone Encounter (Signed)
Call this in to use q 8 hours prn anxiety, #60 with 2 rf

## 2012-11-15 NOTE — Telephone Encounter (Signed)
I called in script and left voice message for pt. 

## 2012-11-15 NOTE — Telephone Encounter (Signed)
Pt is requesting a new rx lorazepam 0.5mg  call into [facility] [phone]. Pt stated last refill was in 2012

## 2012-12-19 ENCOUNTER — Other Ambulatory Visit: Payer: Self-pay | Admitting: Family Medicine

## 2012-12-26 NOTE — Telephone Encounter (Signed)
Call in #30 with 11 rf 

## 2013-04-17 ENCOUNTER — Telehealth: Payer: Self-pay | Admitting: Family Medicine

## 2013-04-17 MED ORDER — TEMAZEPAM 30 MG PO CAPS: 30.0000 mg | ORAL_CAPSULE | Freq: Every evening | ORAL | Status: DC | PRN

## 2013-04-17 NOTE — Telephone Encounter (Signed)
Per [place], okay to call in a 30 day supply, which I did.

## 2013-04-17 NOTE — Telephone Encounter (Signed)
PT is calling to request a 6 month supply of temazepam (RESTORIL) 30 MG capsule be sent to [facility] on [address]. Please assist.

## 2013-05-29 ENCOUNTER — Telehealth: Payer: Self-pay | Admitting: Family Medicine

## 2013-05-29 ENCOUNTER — Other Ambulatory Visit: Payer: Self-pay | Admitting: Family Medicine

## 2013-05-29 NOTE — Telephone Encounter (Signed)
Refill request for Temazepam 30 mg take 1 po qhs prn.

## 2013-05-30 MED ORDER — TEMAZEPAM 30 MG PO CAPS: 30.0000 mg | ORAL_CAPSULE | Freq: Every evening | ORAL | Status: DC | PRN

## 2013-05-30 NOTE — Telephone Encounter (Signed)
Okay for 6 months 

## 2013-05-30 NOTE — Telephone Encounter (Signed)
I called in script 

## 2013-06-16 ENCOUNTER — Telehealth: Payer: Self-pay | Admitting: Family Medicine

## 2013-06-16 NOTE — Telephone Encounter (Signed)
Refill request for Androgel pump 1.62 % apply 8 pumps per day and dispense 300 gram, call in to [facility].

## 2013-06-16 NOTE — Telephone Encounter (Signed)
Okay for 6 months 

## 2013-06-19 MED ORDER — TESTOSTERONE 20.25 MG/ACT (1.62%) TD GEL: 8.0000 "application " | Freq: Every day | TRANSDERMAL | Status: DC

## 2013-06-19 NOTE — Telephone Encounter (Signed)
Rx called in to pharmacy. 

## 2013-07-13 ENCOUNTER — Other Ambulatory Visit: Payer: Self-pay

## 2013-08-28 ENCOUNTER — Telehealth: Payer: Self-pay | Admitting: Family Medicine

## 2013-08-28 NOTE — Telephone Encounter (Signed)
Pt states he has some sort of growth on his abdomen, interior torso.  No appts except a SD wed. Is it ok to schedule or put w/ someone else?

## 2013-08-28 NOTE — Telephone Encounter (Signed)
Per Dr Clent Ridges, pt can schedule this for January 2015.

## 2013-08-29 NOTE — Telephone Encounter (Signed)
Pt is sch for 09-08-2013

## 2013-08-29 NOTE — Telephone Encounter (Signed)
lmom for pt/kh

## 2013-09-08 ENCOUNTER — Ambulatory Visit (INDEPENDENT_AMBULATORY_CARE_PROVIDER_SITE_OTHER): Payer: BC Managed Care – PPO | Admitting: Family Medicine

## 2013-09-08 ENCOUNTER — Encounter: Payer: Self-pay | Admitting: Family Medicine

## 2013-09-08 VITALS — BP 152/98 | HR 59 | Temp 98.7°F | Wt 180.0 lb

## 2013-09-08 DIAGNOSIS — I809 Phlebitis and thrombophlebitis of unspecified site: Secondary | ICD-10-CM

## 2013-09-08 DIAGNOSIS — R2242 Localized swelling, mass and lump, left lower limb: Secondary | ICD-10-CM

## 2013-09-08 DIAGNOSIS — R229 Localized swelling, mass and lump, unspecified: Secondary | ICD-10-CM

## 2013-09-08 NOTE — Progress Notes (Signed)
   Subjective:    Patient ID: Christopher [name], male    DOB: 10-13-1955, 58 y.o.   MRN: [id number]  HPI Here for a few issues. First he has had a long thin firm structure on the left side of the abdomen for the past 2 weeks. It is not tender. No trauma per se, but this came up after he leaned over the edge of the bed on his truck to pick up a heavy box. No chest pain or SOB. Also for 6 months he has had a non-tender lump on the bottom of the left foot.    Review of Systems  Constitutional: Negative.   Respiratory: Negative.   Cardiovascular: Negative.        Objective:   Physical Exam  Constitutional: He appears well-developed and well-nourished. No distress.  Cardiovascular: Normal rate, regular rhythm, normal heart sounds and intact distal pulses.   Pulmonary/Chest: Effort normal and breath sounds normal.  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 He will take 3 aspirins a day (325 mg) for the next 3-4 weeks and use warm compresses. He also has a calcium deposit along the left foot arch. Recheck prn

## 2013-09-08 NOTE — Progress Notes (Signed)
Pre visit review using our clinic review tool, if applicable. No additional management support is needed unless otherwise documented below in the visit note. 

## 2013-11-21 ENCOUNTER — Telehealth: Payer: Self-pay | Admitting: Internal Medicine

## 2013-11-21 ENCOUNTER — Encounter: Payer: Self-pay | Admitting: *Deleted

## 2013-11-21 NOTE — Telephone Encounter (Signed)
Left message for pt to call back.  Pt states he has been having abdominal pain and bloating after he eats. Requesting to be seen sooner than 1st available. Pt scheduled to see Tye Savoy NP Friday at 2:30pm. Pt aware of appt.

## 2013-11-24 ENCOUNTER — Encounter: Payer: Self-pay | Admitting: Nurse Practitioner

## 2013-11-24 ENCOUNTER — Other Ambulatory Visit (INDEPENDENT_AMBULATORY_CARE_PROVIDER_SITE_OTHER): Payer: BC Managed Care – PPO

## 2013-11-24 ENCOUNTER — Ambulatory Visit (INDEPENDENT_AMBULATORY_CARE_PROVIDER_SITE_OTHER): Payer: BC Managed Care – PPO | Admitting: Nurse Practitioner

## 2013-11-24 VITALS — BP 122/68 | HR 64 | Ht <= 58 in | Wt 180.4 lb

## 2013-11-24 DIAGNOSIS — R141 Gas pain: Secondary | ICD-10-CM

## 2013-11-24 DIAGNOSIS — R6881 Early satiety: Secondary | ICD-10-CM

## 2013-11-24 DIAGNOSIS — R143 Flatulence: Secondary | ICD-10-CM

## 2013-11-24 DIAGNOSIS — R140 Abdominal distension (gaseous): Secondary | ICD-10-CM

## 2013-11-24 DIAGNOSIS — R109 Unspecified abdominal pain: Secondary | ICD-10-CM

## 2013-11-24 DIAGNOSIS — R142 Eructation: Secondary | ICD-10-CM

## 2013-11-24 LAB — CBC WITH DIFFERENTIAL/PLATELET
Basophils Absolute: 0 10*3/uL (ref 0.0–0.1)
Basophils Relative: 0.2 % (ref 0.0–3.0)
EOS PCT: 2.5 % (ref 0.0–5.0)
Eosinophils Absolute: 0.1 10*3/uL (ref 0.0–0.7)
HEMATOCRIT: 43.1 % (ref 39.0–52.0)
HEMOGLOBIN: 14.4 g/dL (ref 13.0–17.0)
LYMPHS ABS: 1.8 10*3/uL (ref 0.7–4.0)
Lymphocytes Relative: 34.6 % (ref 12.0–46.0)
MCHC: 33.6 g/dL (ref 30.0–36.0)
MCV: 92.8 fl (ref 78.0–100.0)
MONO ABS: 0.5 10*3/uL (ref 0.1–1.0)
Monocytes Relative: 9.4 % (ref 3.0–12.0)
NEUTROS ABS: 2.8 10*3/uL (ref 1.4–7.7)
Neutrophils Relative %: 53.3 % (ref 43.0–77.0)
Platelets: 212 10*3/uL (ref 150.0–400.0)
RBC: 4.64 Mil/uL (ref 4.22–5.81)
RDW: 14.1 % (ref 11.5–14.6)
WBC: 5.3 10*3/uL (ref 4.5–10.5)

## 2013-11-24 LAB — COMPREHENSIVE METABOLIC PANEL
ALT: 19 U/L (ref 0–53)
AST: 24 U/L (ref 0–37)
Albumin: 4.3 g/dL (ref 3.5–5.2)
Alkaline Phosphatase: 40 U/L (ref 39–117)
BILIRUBIN TOTAL: 0.8 mg/dL (ref 0.3–1.2)
BUN: 16 mg/dL (ref 6–23)
CO2: 29 meq/L (ref 19–32)
CREATININE: 1 mg/dL (ref 0.4–1.5)
Calcium: 9.4 mg/dL (ref 8.4–10.5)
Chloride: 104 mEq/L (ref 96–112)
GFR: 81.15 mL/min (ref >60.00)
GLUCOSE: 97 mg/dL (ref 70–99)
Potassium: 4.1 mEq/L (ref 3.5–5.1)
Sodium: 140 mEq/L (ref 135–145)
Total Protein: 6.4 g/dL (ref 6.0–8.3)

## 2013-11-24 LAB — TSH: TSH: 2.58 u[IU]/mL (ref 0.35–5.50)

## 2013-11-24 NOTE — Patient Instructions (Addendum)
You have been scheduled for an endoscopy with propofol with Dr. Henrene Pastor. Please follow written instructions given to you at your visit today. If you use inhalers (even only as needed), please bring them with you on the day of your procedure. Your physician has requested that you go to www.startemmi.com and enter the access code given to you at your visit today. This web site gives a general overview about your procedure. However, you should still follow specific instructions given to you by our office regarding your preparation for the procedure.  Your physician has requested that you go to the basement for the following lab work before leaving today: CMET TSH CBC

## 2013-11-24 NOTE — Progress Notes (Signed)
HPI :   Patient is a 58 year old male known to Dr. Henrene Pastor. He had a screening colonoscopy January 2014 with findings of an ulcer in the ascending colon, mild diverticulosis, and polyps. Polyps were adenomatous. Ulcer biopsy suggested NSAID related injury or ischemic injury.  Patient comes in today with a one-year history of bloating and early satiety. He has bloating almost every time he eats. Symptoms progressive over the last 4-5 months. Patient does have an appetite but just can't eat the quantity that he has used to. No weight loss. No nausea bowel movements overall okay but it is not quite as regular as they used to be. Patient takes helium every day. She is worried about some sort of intestinal blockage or pancreatic cancer. Symptoms do get worse if he has a couple of beers, he is worried about his liver.  As far as the bloating, patient does not use.products, symptoms not related to dairy products. No recent medication changes  Past Medical History  Diagnosis Date  . Depression   . Insomnia   . Hypertension   . Colon polyps 09/26/2012    TUBULAR ADENOMA (X1)    Family History  Problem Relation Age of Onset  . Hypertension Father   . Hypertension Brother   . Colon polyps Maternal Uncle    History  Substance Use Topics  . Smoking status: Never Smoker   . Smokeless tobacco: Never Used  . Alcohol Use: 1.5 oz/week    3 drink(s) per week   Current Outpatient Prescriptions  Medication Sig Dispense Refill  . fish oil-omega-3 fatty acids 1000 MG capsule Take 1 g by mouth daily.      Marland Kitchen LORazepam (ATIVAN) 0.5 MG tablet Take 0.5 mg by mouth as needed for anxiety.      . temazepam (RESTORIL) 30 MG capsule Take 1 capsule (30 mg total) by mouth at bedtime as needed for sleep.  30 capsule  5  . Testosterone (ANDROGEL PUMP) 20.25 MG/ACT (1.62%) GEL Apply 8 application topically daily.  300 g  5   No current facility-administered medications for this visit.   No Known  Allergies   Review of Systems: All systems reviewed and negative except where noted in HPI.   Physical Exam: BP 122/68  Pulse 64  Ht 6" (0.152 m)  Wt 180 lb 6.4 oz (81.829 kg)  BMI 3541.77 kg/m2 Constitutional: Pleasant,well-developed, white male in no acute distress. HEENT: Normocephalic and atraumatic. Conjunctivae are normal. No scleral icterus. Neck supple.  Cardiovascular: Normal rate, regular rhythm.  Pulmonary/chest: Effort normal and breath sounds normal. No wheezing, rales or rhonchi. Abdominal: Soft, nondistended, nontender. Bowel sounds active throughout. There are no masses palpable. No hepatomegaly. Extremities: no edema Lymphadenopathy: No cervical adenopathy noted. Neurological: Alert and oriented to person place and time. Skin: Skin is warm and dry. No rashes noted. Psychiatric: Normal mood and affect. Behavior is normal.   ASSESSMENT AND PLAN:  26. 58 year old male with one-year history of postprandial bloating, early satiety. Symptoms progressive over the last 4-5 months . No weight loss. Patient is concerned about some sort of an intestinal blockage, he is concerned about pancreatic or other cancers. He questions if this could be gallbladder disease. Reassured the patient that he has no alarming symptoms but workup would be needed, especially given early satiety. Will schedule him for an upper endoscopy. Celiac disease doubtful but small bowel biopsies can be done at the time if warranted. We did talk about possible etiologies such as biliary dyskinesia,  delayed gastric emptying,etc... we discussed gastric neoplasm low likelihood that this was cause of his symptoms. The benefits as well as risks of upper endoscopy were discussed with the patient, he agrees to proceed. Patient has not had any recent labs. Will check a CMP, CBC and a TSH today Further workup pending EGD results.

## 2013-11-27 ENCOUNTER — Ambulatory Visit (AMBULATORY_SURGERY_CENTER): Payer: BC Managed Care – PPO | Admitting: Internal Medicine

## 2013-11-27 ENCOUNTER — Telehealth: Payer: Self-pay

## 2013-11-27 ENCOUNTER — Encounter: Payer: Self-pay | Admitting: Internal Medicine

## 2013-11-27 ENCOUNTER — Other Ambulatory Visit: Payer: Self-pay

## 2013-11-27 VITALS — BP 140/93 | HR 56 | Temp 97.7°F | Resp 24 | Wt 180.0 lb

## 2013-11-27 DIAGNOSIS — R109 Unspecified abdominal pain: Secondary | ICD-10-CM

## 2013-11-27 DIAGNOSIS — R6881 Early satiety: Secondary | ICD-10-CM

## 2013-11-27 MED ORDER — SODIUM CHLORIDE 0.9 % IV SOLN: 500.0000 mL | INTRAVENOUS | Status: DC

## 2013-11-27 NOTE — Patient Instructions (Signed)
YOU HAD AN ENDOSCOPIC PROCEDURE TODAY AT [facility]: Refer to the procedure report that was given to you for any specific questions about what was found during the examination.  If the procedure report does not answer your questions, please call your gastroenterologist to clarify.  If you requested that your care partner not be given the details of your procedure findings, then the procedure report has been included in a sealed envelope for you to review at your convenience later.  YOU SHOULD EXPECT: Some feelings of bloating in the abdomen. Passage of more gas than usual.  Walking can help get rid of the air that was put into your GI tract during the procedure and reduce the bloating. If you had a lower endoscopy (such as a colonoscopy or flexible sigmoidoscopy) you may notice spotting of blood in your stool or on the toilet paper. If you underwent a bowel prep for your procedure, then you may not have a normal bowel movement for a few days.  DIET: Your first meal following the procedure should be a light meal and then it is ok to progress to your normal diet.  A half-sandwich or bowl of soup is an example of a good first meal.  Heavy or fried foods are harder to digest and may make you feel nauseous or bloated.  Likewise meals heavy in dairy and vegetables can cause extra gas to form and this can also increase the bloating.  Drink plenty of fluids but you should avoid alcoholic beverages for 24 hours.  ACTIVITY: Your care partner should take you home directly after the procedure.  You should plan to take it easy, moving slowly for the rest of the day.  You can resume normal activity the day after the procedure however you should NOT DRIVE or use heavy machinery for 24 hours (because of the sedation medicines used during the test).    SYMPTOMS TO REPORT IMMEDIATELY: A gastroenterologist can be reached at any hour.  During normal business hours, 8:30 AM to 5:00 PM Monday through Friday,  call [phone].  After hours and on weekends, please call the GI answering service at [phone] who will take a message and have the physician on call contact you.     Following upper endoscopy (EGD)  Vomiting of blood or coffee ground material  New chest pain or pain under the shoulder blades  Painful or persistently difficult swallowing  New shortness of breath  Fever of 100F or higher  Black, tarry-looking stools  FOLLOW UP: If any biopsies were taken you will be contacted by phone or by letter within the next 1-3 weeks.  Call your gastroenterologist if you have not heard about the biopsies in 3 weeks.  Our staff will call the home number listed on your records the next business day following your procedure to check on you and address any questions or concerns that you may have at that time regarding the information given to you following your procedure. This is a courtesy call and so if there is no answer at the home number and we have not heard from you through the emergency physician on call, we will assume that you have returned to your regular daily activities without incident.  SIGNATURES/CONFIDENTIALITY:   Dr. Blanch Media office will schedule an abdominal ulstasound.  You will be called with results.  MiraLax 1 Scoop (17grams) in 8 ounces of water daily.  Routine follow-up with Dr. Henrene Pastor in 4 weeks, his office will be  in touch to schedule an appoinment. You and/or your care partner have signed paperwork which will be entered into your electronic medical record.  These signatures attest to the fact that that the information above on your After Visit Summary has been reviewed and is understood.  Full responsibility of the confidentiality of this discharge information lies with you and/or your care-partner.

## 2013-11-27 NOTE — Op Note (Signed)
[place]  [address] [place], [phone]   ENDOSCOPY PROCEDURE REPORT  PATIENT: [name], Christopher.  MR#: [id number] BIRTHDATE: 1956-07-02 , 57  yrs. old GENDER: Male ENDOSCOPIST: Eustace Quail, MD REFERRED BY:  .  Self / Office PROCEDURE DATE:  11/27/2013 PROCEDURE:  EGD, diagnostic ASA CLASS:     Class II INDICATIONS:  Dyspepsia. Early satiety MEDICATIONS: MAC sedation, administered by CRNA and propofol (Diprivan) 200mg  IV TOPICAL ANESTHETIC: Cetacaine Spray  DESCRIPTION OF PROCEDURE: After the risks benefits and alternatives of the procedure were thoroughly explained, informed consent was obtained.  The LB [id number] endoscope was introduced through the mouth and advanced to the second portion of the duodenum. Without limitations.  The instrument was slowly withdrawn as the mucosa was fully examined.     EXAM:The upper, middle and distal third of the esophagus were carefully inspected and no abnormalities were noted.  The z-line was well seen at the GEJ.  The endoscope was pushed into the fundus which was normal including a retroflexed view.  The antrum, gastric body, first and second part of the duodenum were unremarkable. Retroflexed views revealed no abnormalities.     The scope was then withdrawn from the patient and the procedure completed.  COMPLICATIONS: There were no complications.  ENDOSCOPIC IMPRESSION: 1.Normal EGD  RECOMMENDATIONS: 1.  My office will arrange for you to have an abdominal ultrasound performed "abdominal discomfort". We will call you with the results when available. 2.  MiraLax (GlycoLax) 1 scoop (17 g) in 8 ounces of water daily 3. Routine office followup with Dr. Henrene Pastor in 4 weeks  REPEAT EXAM:  eSigned:  Eustace Quail, MD 11/27/2013 1:44 PM   JF:[id number] Raymon Mutton, MD and The Patient

## 2013-11-27 NOTE — Telephone Encounter (Signed)
Called pt with appt and he states he needs a day next week. Pt given the number [phone] to reschedule the [place] to a day that works for him.

## 2013-11-27 NOTE — Progress Notes (Signed)
Procedure ends, to recovery, report gven and VSS.

## 2013-11-27 NOTE — Telephone Encounter (Signed)
Pt scheduled for [place] of abdomen at [facility] 11/30/13@8am . Pt to arrive there at 7:45am. Pt to be NPO after midnight. Pt scheduled for OV with Dr. Deatra Ina 01/16/14@9 :15am.

## 2013-11-27 NOTE — Progress Notes (Signed)
Agree with initial assessment and plans as outlined 

## 2013-11-28 ENCOUNTER — Telehealth: Payer: Self-pay | Admitting: *Deleted

## 2013-11-28 NOTE — Telephone Encounter (Signed)
  Follow up Call-  Call back number 11/27/2013 09/26/2012  Post procedure Call Back phone  # [phone]  Permission to leave phone message Yes Yes     Patient questions:  Message left to call us if necessary.

## 2013-11-29 ENCOUNTER — Ambulatory Visit (HOSPITAL_COMMUNITY)
Admission: RE | Admit: 2013-11-29 | Discharge: 2013-11-29 | Disposition: A | Discharge status: Home / Self Care | Payer: BC Managed Care – PPO | Source: Ambulatory Visit | Attending: Internal Medicine | Admitting: Internal Medicine

## 2013-11-29 DIAGNOSIS — R109 Unspecified abdominal pain: Secondary | ICD-10-CM | POA: Insufficient documentation

## 2013-11-30 ENCOUNTER — Ambulatory Visit (HOSPITAL_COMMUNITY): Payer: BC Managed Care – PPO

## 2013-12-01 ENCOUNTER — Telehealth: Payer: Self-pay | Admitting: Family Medicine

## 2013-12-01 NOTE — Telephone Encounter (Signed)
Pt would like to know could he switch to [place]?  Pt has been on Restoril and concerned about long term use of this med. Pt states he will make appt if need. Pt is out of restoril also [facility]

## 2013-12-04 NOTE — Telephone Encounter (Signed)
Switch to Zolpidem 10 mg qhs . Call in #30 with 5 rf

## 2013-12-05 MED ORDER — ZOLPIDEM TARTRATE 10 MG PO TABS: 10.0000 mg | ORAL_TABLET | Freq: Every evening | ORAL | Status: DC | PRN

## 2013-12-05 NOTE — Telephone Encounter (Signed)
I called in script and spoke with pt. 

## 2013-12-31 ENCOUNTER — Other Ambulatory Visit: Payer: Self-pay | Admitting: Family Medicine

## 2014-01-16 ENCOUNTER — Ambulatory Visit: Payer: BC Managed Care – PPO | Admitting: Internal Medicine

## 2014-02-01 ENCOUNTER — Telehealth: Payer: Self-pay | Admitting: Family Medicine

## 2014-02-01 NOTE — Telephone Encounter (Signed)
Call in 300 grams with 5 rf

## 2014-02-01 NOTE — Telephone Encounter (Signed)
[facility] - [place], [place] - [phone] is requesting re-fill on Testosterone (ANDROGEL PUMP) 20.25 MG/ACT (1.62%) GEL ° °

## 2014-02-02 MED ORDER — TESTOSTERONE 20.25 MG/ACT (1.62%) TD GEL: 8.0000 "application " | Freq: Every day | TRANSDERMAL | Status: DC

## 2014-02-02 NOTE — Telephone Encounter (Signed)
I called in script 

## 2014-02-16 ENCOUNTER — Telehealth: Payer: Self-pay | Admitting: Family Medicine

## 2014-02-16 MED ORDER — LORAZEPAM 0.5 MG PO TABS: 0.5000 mg | ORAL_TABLET | ORAL | Status: DC | PRN

## 2014-02-16 NOTE — Telephone Encounter (Signed)
Call in #60 with 5 rf 

## 2014-02-16 NOTE — Telephone Encounter (Signed)
I called in script 

## 2014-02-16 NOTE — Telephone Encounter (Signed)
[place], [place] requesting refill of LORazepam (ATIVAN) 0.5 MG tablet #60 (rx has expired - unused refills remain)

## 2014-05-01 ENCOUNTER — Ambulatory Visit (INDEPENDENT_AMBULATORY_CARE_PROVIDER_SITE_OTHER): Payer: BC Managed Care – PPO | Admitting: Family Medicine

## 2014-05-01 ENCOUNTER — Encounter: Payer: Self-pay | Admitting: Family Medicine

## 2014-05-01 VITALS — BP 148/83 | HR 51 | Ht 72.0 in | Wt 180.0 lb

## 2014-05-01 DIAGNOSIS — F329 Major depressive disorder, single episode, unspecified: Secondary | ICD-10-CM

## 2014-05-01 DIAGNOSIS — G4700 Insomnia, unspecified: Secondary | ICD-10-CM

## 2014-05-01 DIAGNOSIS — F3289 Other specified depressive episodes: Secondary | ICD-10-CM

## 2014-05-01 DIAGNOSIS — F411 Generalized anxiety disorder: Secondary | ICD-10-CM

## 2014-05-01 MED ORDER — BUPROPION HCL ER (XL) 300 MG PO TB24
300.0000 mg | ORAL_TABLET | Freq: Every day | ORAL | Status: DC
Start: 2014-05-01 — End: 2014-10-23

## 2014-05-01 MED ORDER — TEMAZEPAM 30 MG PO CAPS: 30.0000 mg | ORAL_CAPSULE | Freq: Every evening | ORAL | Status: DC | PRN

## 2014-05-01 NOTE — Progress Notes (Signed)
   Subjective:    Patient ID: Christopher [name], male    DOB: Mar 08, 1956, 58 y.o.   MRN: [id number]  HPI Here to discuss his depression and insomnia. He has been using Zolpidem and it helps but he did better with Temazepam. Also he has been struggling a bit with his depression. He has been on Wellbutrin XL 150 mg daily for a year or two, and it has not been as effective lately as before. He is interested in seeing a therapist.    Review of Systems  Constitutional: Negative.   Neurological: Negative.   Psychiatric/Behavioral: Positive for sleep disturbance, dysphoric mood and decreased concentration. Negative for behavioral problems, confusion and agitation. The patient is nervous/anxious.        Objective:   Physical Exam  Constitutional: He is oriented to person, place, and time. He appears well-developed and well-nourished.  Neurological: He is alert and oriented to person, place, and time.  Psychiatric: He has a normal mood and affect. His behavior is normal. Judgment and thought content normal.          Assessment & Plan:  We will get back on Temazepam instead of Zolpidem. We will increase the dose of Wellbutrin to XL 300 mg daily. I gave him info on how to contact [facility].

## 2014-05-01 NOTE — Progress Notes (Signed)
Pre visit review using our clinic review tool, if applicable. No additional management support is needed unless otherwise documented below in the visit note. 

## 2014-05-08 ENCOUNTER — Ambulatory Visit (INDEPENDENT_AMBULATORY_CARE_PROVIDER_SITE_OTHER): Payer: BC Managed Care – PPO | Admitting: Licensed Clinical Social Worker

## 2014-05-08 DIAGNOSIS — F331 Major depressive disorder, recurrent, moderate: Secondary | ICD-10-CM

## 2014-05-11 ENCOUNTER — Telehealth: Payer: Self-pay | Admitting: Family Medicine

## 2014-05-11 NOTE — Telephone Encounter (Signed)
Please let the pharmacy know that we stopped the Zolpidem and he is using Temazepam instead

## 2014-05-11 NOTE — Telephone Encounter (Signed)
I called [facility] at [phone] and spoke with [facility] and informed her of the message below and she states she will remove Zolpidem from auto refill.

## 2014-05-11 NOTE — Telephone Encounter (Signed)
Refill request for Zolpidem Tartrate 10 mg take 1 po qhs prn and send to [facility].

## 2014-05-15 ENCOUNTER — Ambulatory Visit (INDEPENDENT_AMBULATORY_CARE_PROVIDER_SITE_OTHER): Payer: BC Managed Care – PPO | Admitting: Licensed Clinical Social Worker

## 2014-05-15 DIAGNOSIS — F331 Major depressive disorder, recurrent, moderate: Secondary | ICD-10-CM

## 2014-05-29 ENCOUNTER — Telehealth: Payer: Self-pay | Admitting: Family Medicine

## 2014-05-29 ENCOUNTER — Ambulatory Visit (INDEPENDENT_AMBULATORY_CARE_PROVIDER_SITE_OTHER): Payer: BC Managed Care – PPO | Admitting: Licensed Clinical Social Worker

## 2014-05-29 DIAGNOSIS — F331 Major depressive disorder, recurrent, moderate: Secondary | ICD-10-CM

## 2014-05-29 NOTE — Telephone Encounter (Signed)
After meeting with Christopher [name] a few times, he has agreed to try adding an SSRI to his Wellbutrin. Call in Lexapro 10 mg daily, #30 with 2 rf.

## 2014-05-31 MED ORDER — ESCITALOPRAM OXALATE 10 MG PO TABS: 10.0000 mg | ORAL_TABLET | Freq: Every day | ORAL | Status: DC

## 2014-05-31 NOTE — Telephone Encounter (Signed)
I sent script e-scribe. 

## 2014-06-05 ENCOUNTER — Telehealth: Payer: Self-pay | Admitting: Family Medicine

## 2014-06-05 NOTE — Telephone Encounter (Signed)
Pt states the rx escitalopram (LEXAPRO) 10 MG tablet Is not at the [facility] on [address]. It was supposed to be sent Friday, pt has gone back several times. Can you resend?

## 2014-06-05 NOTE — Telephone Encounter (Signed)
I called in script again today.

## 2014-06-12 ENCOUNTER — Ambulatory Visit: Payer: BC Managed Care – PPO | Admitting: Licensed Clinical Social Worker

## 2014-08-20 ENCOUNTER — Other Ambulatory Visit: Payer: Self-pay

## 2014-08-20 NOTE — Telephone Encounter (Signed)
Call in 300 gm with 5 rf

## 2014-08-20 NOTE — Telephone Encounter (Signed)
Rx request for Androgel Pump 1.62% gel- Use 8 pumps per day topically as directed  Pharm:  [facility] [place]  Pls advise.

## 2014-08-21 MED ORDER — TESTOSTERONE 20.25 MG/ACT (1.62%) TD GEL: 8.0000 "application " | Freq: Every day | TRANSDERMAL | Status: DC

## 2014-08-21 NOTE — Telephone Encounter (Signed)
Rx called in to pharmacy. 

## 2014-09-12 ENCOUNTER — Ambulatory Visit: Payer: BC Managed Care – PPO | Admitting: Podiatry

## 2014-09-12 ENCOUNTER — Telehealth: Payer: Self-pay | Admitting: Family Medicine

## 2014-09-12 NOTE — Telephone Encounter (Signed)
Refill request for Lorazepam 0.5 mg take 1 po qd prn and send to [facility].

## 2014-09-13 NOTE — Telephone Encounter (Signed)
Change the sig for this to read "take twice a day as needed for anxiety", calll in #60 with 5 rf

## 2014-09-14 MED ORDER — LORAZEPAM 0.5 MG PO TABS: 0.5000 mg | ORAL_TABLET | Freq: Two times a day (BID) | ORAL | Status: DC | PRN

## 2014-09-14 NOTE — Telephone Encounter (Signed)
I called in script 

## 2014-10-12 ENCOUNTER — Telehealth: Payer: Self-pay | Admitting: Family Medicine

## 2014-10-12 NOTE — Telephone Encounter (Signed)
Pt said he has question about the following rx but he make an appt

## 2014-10-16 ENCOUNTER — Ambulatory Visit: Payer: BC Managed Care – PPO | Admitting: Family Medicine

## 2014-10-23 ENCOUNTER — Encounter: Payer: Self-pay | Admitting: Family Medicine

## 2014-10-23 ENCOUNTER — Ambulatory Visit (INDEPENDENT_AMBULATORY_CARE_PROVIDER_SITE_OTHER): Payer: BC Managed Care – PPO | Admitting: Family Medicine

## 2014-10-23 VITALS — BP 150/93 | HR 62 | Temp 97.8°F | Wt 181.0 lb

## 2014-10-23 DIAGNOSIS — F32A Depression, unspecified: Secondary | ICD-10-CM

## 2014-10-23 DIAGNOSIS — H409 Unspecified glaucoma: Secondary | ICD-10-CM | POA: Insufficient documentation

## 2014-10-23 DIAGNOSIS — F329 Major depressive disorder, single episode, unspecified: Secondary | ICD-10-CM

## 2014-10-23 DIAGNOSIS — I10 Essential (primary) hypertension: Secondary | ICD-10-CM

## 2014-10-23 MED ORDER — BRIMONIDINE TARTRATE 0.2 % OP SOLN: 1.0000 [drp] | Freq: Two times a day (BID) | OPHTHALMIC | Status: DC

## 2014-10-23 MED ORDER — LOSARTAN POTASSIUM 100 MG PO TABS: 100.0000 mg | ORAL_TABLET | Freq: Every day | ORAL | Status: DC

## 2014-10-23 NOTE — Progress Notes (Signed)
Pre visit review using our clinic review tool, if applicable. No additional management support is needed unless otherwise documented below in the visit note. 

## 2014-10-23 NOTE — Progress Notes (Signed)
   Subjective:    Patient ID: Christopher [name], male    DOB: Sep 18, 1955, 59 y.o.   MRN: [id number]  HPI Here to follow up on depression and HTN. Last year he was taking both Wellbutrin XL 300 mg and Lexapro 10 mg daily, and this helped his moods. He went to therapy about 3 times but never follow up on this after that. His BP also went up and he took Losartan HCT for a few months. His BP normalized but he got a bit orthostatic at times. He wound up stopping all these meds for about 6 months until a few weeks ago. His depression got a little worse so he started taking 1/2 of a Wellbutrin XL 150 mg tablet daily. He feels a little better but his BP has gone back up to the 919T or 660A systolic at home. He denies chest pain or SOB or HAs. His wife wants him to get back into therapy but he has been resistant to this idea.    Review of Systems  Constitutional: Negative.   Respiratory: Negative.   Cardiovascular: Negative.   Neurological: Negative.   Psychiatric/Behavioral: Positive for dysphoric mood. Negative for confusion, decreased concentration and agitation. The patient is not nervous/anxious.        Objective:   Physical Exam  Constitutional: He is oriented to person, place, and time. He appears well-developed and well-nourished.  Cardiovascular: Normal rate, regular rhythm, normal heart sounds and intact distal pulses.   Pulmonary/Chest: Effort normal and breath sounds normal.  Neurological: He is alert and oriented to person, place, and time.  Psychiatric: He has a normal mood and affect. His behavior is normal. Judgment and thought content normal.          Assessment & Plan:  The Wellbutrin seems to aggravate his BP so I asked him to stop this. Instead he will take Lexapro 10 mg daily. Get on Losartan 100 mg daily and we will leave the HCT out of it. I encouraged him to get back into therapy and he agreed. He will check the BP at home and let me know in 3 weeks how he is doing

## 2014-10-23 NOTE — Addendum Note (Signed)
Addended by: Alysia Penna A on: 10/23/2014 05:49 PM   Modules accepted: Orders

## 2014-10-24 ENCOUNTER — Telehealth: Payer: Self-pay | Admitting: Family Medicine

## 2014-10-24 NOTE — Telephone Encounter (Signed)
emmi emailed °

## 2014-11-15 ENCOUNTER — Other Ambulatory Visit: Payer: Self-pay | Admitting: Family Medicine

## 2014-11-23 ENCOUNTER — Ambulatory Visit: Payer: BC Managed Care – PPO | Admitting: Psychology

## 2014-12-03 ENCOUNTER — Telehealth: Payer: Self-pay | Admitting: Family Medicine

## 2014-12-03 NOTE — Telephone Encounter (Signed)
Refill request for Temazepam 30 mg take 1 po qhs prn and send to [facility]. Pt last here in the office on 10/23/14.

## 2014-12-03 NOTE — Telephone Encounter (Signed)
PCP NA   refill disp 14#    X 1  Further rx from PCP Dr Sarajane Jews. Do not take with lorazepam

## 2014-12-04 MED ORDER — TEMAZEPAM 30 MG PO CAPS: 30.0000 mg | ORAL_CAPSULE | Freq: Every evening | ORAL | Status: DC | PRN

## 2014-12-04 NOTE — Telephone Encounter (Signed)
I called in script 

## 2014-12-11 ENCOUNTER — Encounter: Payer: Self-pay | Admitting: Family Medicine

## 2014-12-12 NOTE — Telephone Encounter (Signed)
That is good to hear 

## 2015-01-28 ENCOUNTER — Ambulatory Visit (INDEPENDENT_AMBULATORY_CARE_PROVIDER_SITE_OTHER): Payer: BC Managed Care – PPO | Admitting: Podiatry

## 2015-01-28 ENCOUNTER — Ambulatory Visit (INDEPENDENT_AMBULATORY_CARE_PROVIDER_SITE_OTHER): Payer: BC Managed Care – PPO

## 2015-01-28 ENCOUNTER — Encounter: Payer: Self-pay | Admitting: Podiatry

## 2015-01-28 VITALS — BP 131/76 | HR 60 | Resp 12

## 2015-01-28 DIAGNOSIS — R52 Pain, unspecified: Secondary | ICD-10-CM | POA: Diagnosis not present

## 2015-01-28 DIAGNOSIS — M7672 Peroneal tendinitis, left leg: Secondary | ICD-10-CM

## 2015-01-28 MED ORDER — DEXAMETHASONE SODIUM PHOSPHATE 120 MG/30ML IJ SOLN
4.0000 mg | Freq: Once | INTRAMUSCULAR | Status: AC
  Administered 2015-01-28: 4 mg via INTRA_ARTICULAR

## 2015-01-28 NOTE — Progress Notes (Signed)
   Subjective:    Patient ID: Christopher [name], male    DOB: 1956-05-21, 59 y.o.   MRN: [id number]  HPI N-PAINFUL L-LT FOOT LATERAL SIDE D-5 MONTHS O-SLOWLY C-WORSE A-FIRST STEP IN THE MORNING T-INSERTS, ICE, ADVIL  Patient describes a history of plantar fasciitis in the left foot prior to the pain developing in the lateral border of the left foot which caused altered walking pattern. He has a history of plantar fasciitis treated by our office multiple years ago with a pair of rigid orthotics that he has with him today. He also went to another podiatrist or releasing for plantar fasciitis shoe dispensed a replacement orthotic which she could not tolerate does not wear.  Review of Systems  Musculoskeletal: Positive for gait problem.  All other systems reviewed and are negative.      Objective:   Physical Exam  Orientated 3  Vascular: DP pulses 2/4 bilaterally PT pulses 2/4 bilaterally Capillary reflex immediate bilaterally  Neurological: Ankle reflex equal and reactive bilaterally  Dermatological: Texture and turgor within normal limits No skin lesions noted bilaterally  Musculoskeletal: No deformities noted bilaterally On eversion of the left foot there is palpable tenderness on the inferior aspect of the base the fifth metatarsal cuboid area Eversion 5/5 bilaterally Inversion 5/5 bilaterally Plantar flexion 5/5 bilaterally Dorsi flexion 5/5 bilaterally  X-ray examination weightbearing left foot  Intact bony structure without fracture and/or dislocation Bone density appears adequate Joint spaces appear adequate Small inferior calcaneal spur  Radiographic impression: No acute bony abnormality noted left foot      Assessment & Plan:   Assessment: Peroneal tendinitis left in the cuboid area  Plan: I discussed the results of x-ray examination and physical examination with patient today in detail. As his symptoms could be duplicated with eversion of the foot  and direct palpation plantar base fifth metatarsal cuboid area I recommended a local dexamethasone injection into the area and patient verbally consents  Skin is prepped with alcohol and Betadine and 4 mg of dexamethasone phosphate mixed with 5 mg of plain Marcaine were injected into the peroneal area on the base of fifth left metatarsal/cuboid area. Patient tolerated procedure without any difficulty  Advised patient to wear rigid foot orthotics if tolerated. I attached a additional pad to the existing comfortable rigid foot orthotic to offload the base of fifth metatarsal cuboid area  Reappoint in 4-6 weeks his symptoms do not improve. Would consider the possibility of ankle stabilizer if symptoms persisted.

## 2015-01-28 NOTE — Patient Instructions (Signed)
Wear the rigid orthotics on a continuous daily basis Remove the pad on the left orthotic if uncomfortable Return if the symptoms are not improving 4-6 weeks

## 2015-02-21 DIAGNOSIS — R52 Pain, unspecified: Secondary | ICD-10-CM

## 2015-02-22 ENCOUNTER — Telehealth: Payer: Self-pay | Admitting: *Deleted

## 2015-02-22 NOTE — Telephone Encounter (Signed)
Patient is requesting a refill of Androgel 1.62% gel, use 8 pumps daily as directed Christopher [name], [place] [phone]

## 2015-02-22 NOTE — Telephone Encounter (Signed)
Refill for 6 months. 

## 2015-02-25 MED ORDER — TESTOSTERONE 20.25 MG/ACT (1.62%) TD GEL: 8.0000 "application " | Freq: Every day | TRANSDERMAL | Status: DC

## 2015-02-25 NOTE — Telephone Encounter (Signed)
I called in script 

## 2015-03-13 ENCOUNTER — Ambulatory Visit: Payer: BC Managed Care – PPO | Admitting: Podiatry

## 2015-03-27 ENCOUNTER — Telehealth: Payer: Self-pay | Admitting: *Deleted

## 2015-03-27 NOTE — Telephone Encounter (Signed)
Patient is requesting a refill of Lorazepam 0.5 mg  [facility] [place] [place] [phone]

## 2015-03-28 MED ORDER — LORAZEPAM 0.5 MG PO TABS: 0.5000 mg | ORAL_TABLET | Freq: Two times a day (BID) | ORAL | Status: DC | PRN

## 2015-03-28 NOTE — Telephone Encounter (Signed)
Call in #60 with 5 rf 

## 2015-03-28 NOTE — Telephone Encounter (Signed)
I called in script 

## 2015-05-02 ENCOUNTER — Ambulatory Visit: Payer: BC Managed Care – PPO | Admitting: Podiatry

## 2015-05-03 ENCOUNTER — Ambulatory Visit: Payer: BC Managed Care – PPO | Admitting: Podiatry

## 2015-06-11 ENCOUNTER — Other Ambulatory Visit: Payer: Self-pay | Admitting: Family Medicine

## 2015-06-18 ENCOUNTER — Ambulatory Visit (INDEPENDENT_AMBULATORY_CARE_PROVIDER_SITE_OTHER): Payer: BC Managed Care – PPO | Admitting: Family Medicine

## 2015-06-18 ENCOUNTER — Encounter: Payer: Self-pay | Admitting: Family Medicine

## 2015-06-18 VITALS — BP 146/79 | HR 47 | Temp 97.8°F | Ht 72.0 in | Wt 185.0 lb

## 2015-06-18 DIAGNOSIS — Z0000 Encounter for general adult medical examination without abnormal findings: Secondary | ICD-10-CM | POA: Diagnosis not present

## 2015-06-18 DIAGNOSIS — E291 Testicular hypofunction: Secondary | ICD-10-CM | POA: Diagnosis not present

## 2015-06-18 DIAGNOSIS — Z23 Encounter for immunization: Secondary | ICD-10-CM

## 2015-06-18 LAB — CBC WITH DIFFERENTIAL/PLATELET
BASOS ABS: 0 10*3/uL (ref 0.0–0.1)
Basophils Relative: 0.7 % (ref 0.0–3.0)
Eosinophils Absolute: 0.2 10*3/uL (ref 0.0–0.7)
Eosinophils Relative: 3.1 % (ref 0.0–5.0)
HEMATOCRIT: 47 % (ref 39.0–52.0)
Hemoglobin: 15.8 g/dL (ref 13.0–17.0)
LYMPHS PCT: 30.9 % (ref 12.0–46.0)
Lymphs Abs: 1.6 10*3/uL (ref 0.7–4.0)
MCHC: 33.6 g/dL (ref 30.0–36.0)
MCV: 95 fl (ref 78.0–100.0)
MONOS PCT: 11 % (ref 3.0–12.0)
Monocytes Absolute: 0.6 10*3/uL (ref 0.1–1.0)
Neutro Abs: 2.8 10*3/uL (ref 1.4–7.7)
Neutrophils Relative %: 54.3 % (ref 43.0–77.0)
Platelets: 205 10*3/uL (ref 150.0–400.0)
RBC: 4.94 Mil/uL (ref 4.22–5.81)
RDW: 14.4 % (ref 11.5–15.5)
WBC: 5.1 10*3/uL (ref 4.0–10.5)

## 2015-06-18 LAB — LIPID PANEL
CHOL/HDL RATIO: 3
CHOLESTEROL: 213 mg/dL — AB (ref 0–200)
HDL: 70.5 mg/dL (ref >39.00)
LDL CALC: 131 mg/dL — AB (ref 0–99)
NonHDL: 142.31
Triglycerides: 57 mg/dL (ref 0.0–149.0)
VLDL: 11.4 mg/dL (ref 0.0–40.0)

## 2015-06-18 LAB — BASIC METABOLIC PANEL
BUN: 18 mg/dL (ref 6–23)
CO2: 31 mEq/L (ref 19–32)
Calcium: 9.9 mg/dL (ref 8.4–10.5)
Chloride: 104 mEq/L (ref 96–112)
Creatinine, Ser: 0.99 mg/dL (ref 0.40–1.50)
GFR: 82.58 mL/min (ref >60.00)
Glucose, Bld: 98 mg/dL (ref 70–99)
POTASSIUM: 4.6 meq/L (ref 3.5–5.1)
SODIUM: 142 meq/L (ref 135–145)

## 2015-06-18 LAB — POCT URINALYSIS DIPSTICK
Bilirubin, UA: NEGATIVE
Blood, UA: NEGATIVE
Glucose, UA: NEGATIVE
Ketones, UA: NEGATIVE
LEUKOCYTES UA: NEGATIVE
NITRITE UA: NEGATIVE
PROTEIN UA: NEGATIVE
Spec Grav, UA: 1.015
UROBILINOGEN UA: 0.2
pH, UA: 8

## 2015-06-18 LAB — TSH: TSH: 2.66 u[IU]/mL (ref 0.35–4.50)

## 2015-06-18 LAB — PSA: PSA: 1.17 ng/mL (ref 0.10–4.00)

## 2015-06-18 LAB — HEPATIC FUNCTION PANEL
ALBUMIN: 4.4 g/dL (ref 3.5–5.2)
ALT: 21 U/L (ref 0–53)
AST: 25 U/L (ref 0–37)
Alkaline Phosphatase: 43 U/L (ref 39–117)
BILIRUBIN DIRECT: 0.2 mg/dL (ref 0.0–0.3)
Total Bilirubin: 0.9 mg/dL (ref 0.2–1.2)
Total Protein: 6.9 g/dL (ref 6.0–8.3)

## 2015-06-18 LAB — TESTOSTERONE: Testosterone: 333.58 ng/dL (ref 300.00–890.00)

## 2015-06-18 NOTE — Progress Notes (Signed)
Pre visit review using our clinic review tool, if applicable. No additional management support is needed unless otherwise documented below in the visit note. 

## 2015-06-18 NOTE — Progress Notes (Signed)
   Subjective:    Patient ID: Christopher [name], male    DOB: [birth year], 59 y.o.   MRN: [id number]  HPI 59 yr old male for a cpx. He feels well in general. He exercises regularly. He checks his BP at home closely and he is usually in the 120s over 70s.    Review of Systems  Constitutional: Negative.   HENT: Negative.   Eyes: Negative.   Respiratory: Negative.   Cardiovascular: Negative.   Gastrointestinal: Negative.   Genitourinary: Negative.   Musculoskeletal: Negative.   Skin: Negative.   Neurological: Negative.   Psychiatric/Behavioral: Negative.        Objective:   Physical Exam  Constitutional: He is oriented to person, place, and time. He appears well-developed and well-nourished. No distress.  HENT:  Head: Normocephalic and atraumatic.  Right Ear: External ear normal.  Left Ear: External ear normal.  Nose: Nose normal.  Mouth/Throat: Oropharynx is clear and moist. No oropharyngeal exudate.  Eyes: Conjunctivae and EOM are normal. Pupils are equal, round, and reactive to light. Right eye exhibits no discharge. Left eye exhibits no discharge. No scleral icterus.  Neck: Neck supple. No JVD present. No tracheal deviation present. No thyromegaly present.  Cardiovascular: Normal rate, regular rhythm, normal heart sounds and intact distal pulses.  Exam reveals no gallop and no friction rub.   No murmur heard. EKG normal with LAFB   Pulmonary/Chest: Effort normal and breath sounds normal. No respiratory distress. He has no wheezes. He has no rales. He exhibits no tenderness.  Abdominal: Soft. Bowel sounds are normal. He exhibits no distension and no mass. There is no tenderness. There is no rebound and no guarding.  Genitourinary: Rectum normal, prostate normal and penis normal. Guaiac negative stool. No penile tenderness.  Musculoskeletal: Normal range of motion. He exhibits no edema or tenderness.  Lymphadenopathy:    He has no cervical adenopathy.  Neurological: He is alert  and oriented to person, place, and time. He has normal reflexes. No cranial nerve deficit. He exhibits normal muscle tone. Coordination normal.  Skin: Skin is warm and dry. No rash noted. He is not diaphoretic. No erythema. No pallor.  Psychiatric: He has a normal mood and affect. His behavior is normal. Judgment and thought content normal.          Assessment & Plan:  Well exam. We discussed diet and exercise advice. Get fasting labs.

## 2015-07-30 ENCOUNTER — Other Ambulatory Visit: Payer: Self-pay | Admitting: Family Medicine

## 2015-09-23 ENCOUNTER — Other Ambulatory Visit: Payer: Self-pay | Admitting: *Deleted

## 2015-09-23 NOTE — Telephone Encounter (Signed)
Requesting refill on Androgel Pump 1.62% Gel  Last filled: 07/26/15 Last seen: 06/18/15   Pharmacy: [facility] [address] [phone]

## 2015-09-23 NOTE — Telephone Encounter (Signed)
Refill for 6 months. 

## 2015-09-25 MED ORDER — TESTOSTERONE 20.25 MG/ACT (1.62%) TD GEL: 8.0000 "application " | Freq: Every day | TRANSDERMAL | Status: DC

## 2015-09-25 NOTE — Telephone Encounter (Signed)
Rx printed - will place to Dr. Sarajane Jews to sign.

## 2015-09-26 ENCOUNTER — Telehealth: Payer: Self-pay | Admitting: Family Medicine

## 2015-09-26 NOTE — Telephone Encounter (Signed)
Please advise 

## 2015-09-26 NOTE — Telephone Encounter (Signed)
Pt is calling to let md know [employer] is not longer covering androgel pump however androderm is covered. [address]. Pt will need PA [phone]. Pt would like to know dr fry is ok with him on androderm

## 2015-09-27 MED ORDER — TESTOSTERONE 4 MG/24HR TD PT24: 1.0000 | MEDICATED_PATCH | Freq: Every day | TRANSDERMAL | Status: DC

## 2015-09-27 NOTE — Telephone Encounter (Signed)
Rx called in as directed.   

## 2015-09-27 NOTE — Telephone Encounter (Signed)
Ok to switch to [employer] patches 4 mg/patch to use daily. Send in 6 month supply

## 2015-10-04 ENCOUNTER — Telehealth: Payer: Self-pay | Admitting: Family Medicine

## 2015-10-04 NOTE — Telephone Encounter (Signed)
In order to submit a PA for Androderm, I need a copy of the patient's updated insurance card. I attempted to contact patient in order to obtain this information, but was unable to reach patient. I will try again later.

## 2015-10-04 NOTE — Telephone Encounter (Addendum)
Pt needs PA on androderm please call [employer] [phone]

## 2015-10-10 ENCOUNTER — Other Ambulatory Visit: Payer: Self-pay

## 2015-10-10 NOTE — Telephone Encounter (Signed)
Left voicemail for patient to return phone call 

## 2015-10-11 NOTE — Telephone Encounter (Signed)
Thank you :)

## 2015-10-11 NOTE — Telephone Encounter (Signed)
Pt states he will bring card after 2pm today.  I will let you know after he comes in.  Thanks!

## 2015-10-16 ENCOUNTER — Telehealth: Payer: Self-pay | Admitting: Family Medicine

## 2015-10-16 NOTE — Telephone Encounter (Signed)
[facility] denied PA for Androdem due to patients testosterone being within or over range for most recent draws.

## 2015-10-16 NOTE — Telephone Encounter (Signed)
I don't know what to tell him. I guess the only options are to wait until the level drops again (recheck in 3 months) and then re-prescribe it, or else he can pay cash for it

## 2015-10-17 NOTE — Telephone Encounter (Signed)
Left message for patient to return phone call.  

## 2015-10-18 ENCOUNTER — Other Ambulatory Visit: Payer: Self-pay | Admitting: Family Medicine

## 2015-10-18 MED ORDER — BRIMONIDINE TARTRATE 0.2 % OP SOLN: 1.0000 [drp] | Freq: Two times a day (BID) | OPHTHALMIC | Status: DC

## 2015-10-18 NOTE — Telephone Encounter (Signed)
I left a voice message with below information. 

## 2015-10-21 ENCOUNTER — Encounter: Payer: Self-pay | Admitting: Family Medicine

## 2015-10-21 ENCOUNTER — Telehealth: Payer: Self-pay | Admitting: Family Medicine

## 2015-10-21 NOTE — Telephone Encounter (Signed)
Please take care of this, thanks

## 2015-10-21 NOTE — Telephone Encounter (Signed)
[place], [place] [phone]  Requesting refill of LORazepam (ATIVAN) 0.5 MG tablet

## 2015-10-22 MED ORDER — LORAZEPAM 0.5 MG PO TABS: 0.5000 mg | ORAL_TABLET | Freq: Two times a day (BID) | ORAL | Status: DC | PRN

## 2015-10-22 NOTE — Telephone Encounter (Signed)
Call in #60 with 5 rf 

## 2015-10-22 NOTE — Telephone Encounter (Signed)
Script was called in and I sent pt a my chart message.

## 2015-11-05 ENCOUNTER — Encounter: Payer: Self-pay | Admitting: Family Medicine

## 2015-11-05 NOTE — Telephone Encounter (Signed)
We have spent a huge amount of time on this for nothing so far. He needs to make an OV so we can make a plan

## 2015-11-11 ENCOUNTER — Encounter: Payer: Self-pay | Admitting: Family Medicine

## 2015-11-11 ENCOUNTER — Telehealth: Payer: Self-pay

## 2015-11-11 ENCOUNTER — Ambulatory Visit (INDEPENDENT_AMBULATORY_CARE_PROVIDER_SITE_OTHER): Payer: BC Managed Care – PPO | Admitting: Family Medicine

## 2015-11-11 VITALS — BP 133/75 | HR 51 | Temp 98.1°F | Ht 72.0 in | Wt 181.0 lb

## 2015-11-11 DIAGNOSIS — E291 Testicular hypofunction: Secondary | ICD-10-CM

## 2015-11-11 MED ORDER — TESTOSTERONE 10 MG/ACT (2%) TD GEL: 4.0000 | Freq: Every day | TRANSDERMAL | Status: DC

## 2015-11-11 NOTE — Telephone Encounter (Signed)
FYI - Patient has appt scheduled today with Dr. Sarajane Jews.

## 2015-11-11 NOTE — Progress Notes (Signed)
Pre visit review using our clinic review tool, if applicable. No additional management support is needed unless otherwise documented below in the visit note. 

## 2015-11-11 NOTE — Telephone Encounter (Deleted)
[facility] - Client [place] Patient Name: NEVILLE GERECKE Gender: Male DOB: 11/12/1949 Age: 60 Y 11 M 26 D Return Phone Number: [phone] (Primary) Address: City/State/Zip: [place] Client [facility] - Client Client Site [facility] Night Physician Carolann Littler Contact Type Call Who Is Calling Patient / Member / Family / Caregiver Call Type Triage / Clinical Relationship To Patient Self Return Phone Number [phone] (Primary) Chief Complaint Heart palpitations or irregular heartbeat Reason for Call Symptomatic / Request for [place] states woke up this am, HR is steady 120-122, can feel it is beating fast [place] Not Listed [facility] (per MD) PreDisposition Call Doctor Translation No Nurse Assessment Nurse: Ruthann Cancer, RN, Apolonio Schneiders Date/Time (Eastern Time): 11/10/2015 9:31:19 AM Confirm and document reason for call. If symptomatic, describe symptoms. You must click the next button to save text entered. ---Caller says he woke up with AM with his heart beating faster around 122 steady. He is not exerting himself. He had a mechanical valve put in in 2010. He denies pain, sweating or difficulty breathing. This has been going on for about 45 minutes that he knows of. Has the patient traveled out of the country within the last 30 days? ---Not Applicable Does the patient have any new or worsening symptoms? ---Yes Will a triage be completed? ---Yes Related visit to physician within the last 2 weeks? ---No Does the PT have any chronic conditions? (i.e. diabetes, asthma, etc.) ---Yes List chronic conditions. ---mechanical heart valve; underactive thyroid Is this a behavioral health or substance abuse call? ---No Guidelines Guideline Title Affirmed Question Affirmed Notes Nurse Date/Time (Eastern Time) Heart Rate and  Heartbeat Questions Age > 60 years (Exception: brief heart beat symptoms that went away and now feels well) Ruthann Cancer, RN, Apolonio Schneiders 11/10/2015 9:33:10 AM PLEASE NOTE: All timestamps contained within this report are represented as [place] Standard Time. CONFIDENTIALTY NOTICE: This fax transmission is intended only for the addressee. It contains information that is legally privileged, confidential or otherwise protected from use or disclosure. If you are not the intended recipient, you are strictly prohibited from reviewing, disclosing, copying using or disseminating any of this information or taking any action in reliance on or regarding this information. If you have received this fax in error, please notify us immediately by telephone so that we can arrange for its return to [place]. Phone: [phone], Toll-Free: [phone], Fax: [phone] Page: 2 of 2 Call Id: [id number] [place]. Time Eilene Ghazi Time) Disposition Final User 11/10/2015 9:37:49 AM Paged On Call back to [facility], RNApolonio Schneiders 11/10/2015 11:19:04 AM Call Completed Julien Girt, RN, Almyra Free 11/10/2015 9:36:51 AM See Physician within 4 Hours (or PCP triage) Yes Ruthann Cancer, RN, Herminio Heads Understands: Yes Disagree/Comply: Comply Care Advice Given Per Guideline SEE PHYSICIAN WITHIN 4 HOURS (or PCP triage): * IF OFFICE WILL BE CLOSED AND PCP TRIAGE REQUIRED: You may need to be seen. Your doctor will want to talk with you to decide what's best. I'll page the doctor now. If you haven't heard from the on-call doctor within 30 minutes, call again. NOTE: If PCP can't be reached, send to [facility] or ER. CALL BACK IF: * You become worse. CARE ADVICE given per Palpitations (Adult) guideline. Comments User: Bertell Maria, RN Date/Time Eilene Ghazi Time): 11/10/2015 9:50:25 AM called pt & gave Dr advice Referrals GO TO FACILITY OTHER - SPECIFY [facility] - ED GO TO FACILITY OTHER - SPECIFY  Paging DoctorName Phone DateTime Result/Outcome Message Type  Notes Eliezer Lofts [id number] 11/10/2015 9:37:49 AM Paged On Call Back to Call Center Doctor Paged please call Lanette Hampshire at [facility] [phone]. thanks! Eliezer Lofts 11/10/2015 9:46:35 AM Spoke with On Call - General Message Result Dr advised that pt be seen today & she recommended the urgent care by [facility]. If they need to send him to the ER that is very easy to do

## 2015-11-11 NOTE — Progress Notes (Signed)
   Subjective:    Patient ID: SEPEHR PLYLER, male    DOB: 1956/06/04, 60 y.o.   MRN: [id number]  HPI Here seeking advice about his testosterone replacement. He has been very frustrated bu the wrong advice he has been getting from his pharmacy about what is covered by his insurance company. We have attempted to give him several forms of this with prior authorizations to this insurance company but these have all been denied. His pharmacy now tells him that he can get a generic testosterone gel with a PA. He feels well in general.    Review of Systems  Constitutional: Negative.   Respiratory: Negative.   Cardiovascular: Negative.   Neurological: Negative.        Objective:   Physical Exam  Constitutional: He is oriented to person, place, and time. He appears well-developed and well-nourished.  Cardiovascular: Normal rate, regular rhythm, normal heart sounds and intact distal pulses.   Pulmonary/Chest: Effort normal and breath sounds normal.  Neurological: He is alert and oriented to person, place, and time.          Assessment & Plan:  Hypogonadism male, we wrote for generic 2% testosterone gel to apply 4 pumps daily. Recheck a blood level after he has been on this for 90 days .

## 2015-11-11 NOTE — Telephone Encounter (Signed)
Dr. Fry is aware.  

## 2015-11-12 ENCOUNTER — Telehealth: Payer: Self-pay

## 2015-11-12 NOTE — Telephone Encounter (Signed)
I called and spoke with [employer] in order to get PA submitted for PA for Androgel 2% - and it has been approved! Patient notified.

## 2015-11-20 NOTE — Telephone Encounter (Signed)
This encounter was created in error - please disregard.

## 2015-11-20 NOTE — Telephone Encounter (Addendum)
  This encounter was created in error - please disregard.  Per clinic staff, Westley Hummer, this after hours telephone encounter was opened incorrectly on this patient.   Selecting the "delete" button for the original telephone note documented by Sandria Bales, CMA as I do not want this information to be read under the notes tab and and any further actions taken as this was documented on the incorrect patient.

## 2015-11-20 NOTE — Addendum Note (Signed)
Addended by: Randie Heinz on: 11/20/2015 04:12 PM   Modules accepted: Level of Service, SmartSet

## 2015-11-20 NOTE — Addendum Note (Signed)
Addended by: Randie Heinz on: 11/20/2015 04:17 PM   Modules accepted: Level of Service, SmartSet

## 2016-01-04 ENCOUNTER — Encounter: Payer: Self-pay | Admitting: Family Medicine

## 2016-01-06 MED ORDER — TESTOSTERONE 10 MG/ACT (2%) TD GEL: 8.0000 | Freq: Every day | TRANSDERMAL | Status: DC

## 2016-01-06 NOTE — Telephone Encounter (Signed)
Please change the rx for his generic testosterone gel to have him apply 8 pumps per day, call in 6 month supply

## 2016-05-20 ENCOUNTER — Telehealth: Payer: Self-pay | Admitting: Family Medicine

## 2016-05-20 NOTE — Telephone Encounter (Signed)
Call in #60 with 5 rf 

## 2016-05-20 NOTE — Telephone Encounter (Signed)
Refill request for Lorazepam 0.5 mg take 1 po bid prn and send to [facility].

## 2016-05-22 MED ORDER — LORAZEPAM 0.5 MG PO TABS: 0.5000 mg | ORAL_TABLET | Freq: Two times a day (BID) | ORAL | 5 refills | Status: DC | PRN

## 2016-05-22 NOTE — Telephone Encounter (Signed)
Rx called in 

## 2016-08-03 ENCOUNTER — Telehealth: Payer: Self-pay | Admitting: Family Medicine

## 2016-08-03 NOTE — Telephone Encounter (Addendum)
Pt would like to resume taking buPROPion (WELLBUTRIN XL) 300 MG 24 hr tablet   Pt not sure if he should start with the 150 mg or 300 mg. Would like dr to decide.  Pt aware dr fry is out today Best boy

## 2016-08-04 NOTE — Telephone Encounter (Signed)
Dr. Fry please advise. Thanks  

## 2016-08-04 NOTE — Telephone Encounter (Signed)
We can start right back on the 300 mg dose. Call in Wellbutrin XL 300 mg daily, #30 with 5 rf

## 2016-08-05 NOTE — Telephone Encounter (Signed)
Done

## 2016-09-03 ENCOUNTER — Other Ambulatory Visit: Payer: Self-pay | Admitting: Family Medicine

## 2016-09-03 NOTE — Telephone Encounter (Signed)
Pt was last seen in the office 06/18/2015 and last labs were done 06/18/2015. Pt would like a refill for Cozaar 100mg . Please advise.

## 2016-09-05 ENCOUNTER — Other Ambulatory Visit: Payer: Self-pay | Admitting: Family Medicine

## 2016-09-05 DIAGNOSIS — E291 Testicular hypofunction: Secondary | ICD-10-CM

## 2016-09-08 NOTE — Telephone Encounter (Signed)
He will need a repeat testosterone level drawn before any refills. I already entered the order

## 2016-09-09 NOTE — Telephone Encounter (Signed)
I sent pt a my chart message with below information.  

## 2016-09-15 ENCOUNTER — Other Ambulatory Visit (INDEPENDENT_AMBULATORY_CARE_PROVIDER_SITE_OTHER): Payer: BC Managed Care – PPO

## 2016-09-15 DIAGNOSIS — E291 Testicular hypofunction: Secondary | ICD-10-CM | POA: Diagnosis not present

## 2016-09-15 LAB — TESTOSTERONE: Testosterone: 96.96 ng/dL — ABNORMAL LOW (ref 300.00–890.00)

## 2016-09-17 ENCOUNTER — Other Ambulatory Visit: Payer: Self-pay | Admitting: Family Medicine

## 2016-09-17 MED ORDER — TESTOSTERONE 10 MG/ACT (2%) TD GEL: 8.0000 | Freq: Every day | TRANSDERMAL | 1 refills | Status: DC

## 2016-12-05 ENCOUNTER — Other Ambulatory Visit: Payer: Self-pay | Admitting: Family Medicine

## 2016-12-13 ENCOUNTER — Other Ambulatory Visit: Payer: Self-pay | Admitting: Family Medicine

## 2016-12-14 NOTE — Telephone Encounter (Signed)
Call in #60 with 5 rf 

## 2016-12-15 NOTE — Telephone Encounter (Signed)
Rx called into patient pharmacy. 

## 2017-02-16 ENCOUNTER — Other Ambulatory Visit: Payer: Self-pay | Admitting: Family Medicine

## 2017-02-22 ENCOUNTER — Telehealth: Payer: Self-pay | Admitting: Family Medicine

## 2017-02-22 DIAGNOSIS — E291 Testicular hypofunction: Secondary | ICD-10-CM

## 2017-02-22 NOTE — Telephone Encounter (Signed)
Pt would like to see dr Loanne Drilling for testosterone issues

## 2017-02-22 NOTE — Telephone Encounter (Signed)
Referral was done  

## 2017-02-22 NOTE — Telephone Encounter (Signed)
I spoke with pt and gave information about referral.

## 2017-02-25 ENCOUNTER — Other Ambulatory Visit: Payer: Self-pay | Admitting: Family Medicine

## 2017-02-25 ENCOUNTER — Telehealth: Payer: Self-pay | Admitting: Family Medicine

## 2017-02-25 NOTE — Telephone Encounter (Signed)
Patient calling to check on status of referral.  Thank you,  -LL

## 2017-02-26 NOTE — Telephone Encounter (Signed)
Please refill for 6 months 

## 2017-04-13 ENCOUNTER — Other Ambulatory Visit: Payer: BC Managed Care – PPO

## 2017-04-13 ENCOUNTER — Ambulatory Visit (INDEPENDENT_AMBULATORY_CARE_PROVIDER_SITE_OTHER): Payer: BC Managed Care – PPO | Admitting: Family Medicine

## 2017-04-13 ENCOUNTER — Encounter: Payer: Self-pay | Admitting: Family Medicine

## 2017-04-13 VITALS — BP 150/93 | HR 61 | Temp 98.2°F | Ht 72.0 in | Wt 180.0 lb

## 2017-04-13 DIAGNOSIS — I10 Essential (primary) hypertension: Secondary | ICD-10-CM

## 2017-04-13 DIAGNOSIS — N401 Enlarged prostate with lower urinary tract symptoms: Secondary | ICD-10-CM

## 2017-04-13 DIAGNOSIS — N138 Other obstructive and reflux uropathy: Secondary | ICD-10-CM

## 2017-04-13 MED ORDER — LOSARTAN POTASSIUM 100 MG PO TABS: 100.0000 mg | ORAL_TABLET | Freq: Every day | ORAL | 3 refills | Status: DC

## 2017-04-13 MED ORDER — AMLODIPINE BESYLATE 5 MG PO TABS
5.0000 mg | ORAL_TABLET | Freq: Every day | ORAL | 3 refills | Status: DC
Start: 2017-04-13 — End: 2018-06-13

## 2017-04-13 NOTE — Patient Instructions (Signed)
WE NOW OFFER   [facility] FAST TRACK!!!  SAME DAY Appointments for ACUTE CARE  Such as: Sprains, Injuries, cuts, abrasions, rashes, muscle pain, joint pain, back pain Colds, flu, sore throats, headache, allergies, cough, fever  Ear pain, sinus and eye infections Abdominal pain, nausea, vomiting, diarrhea, upset stomach Animal/insect bites  3 Easy Ways to Schedule: Walk-In Scheduling Call in scheduling Mychart Sign-up: [email or web address]         

## 2017-04-13 NOTE — Progress Notes (Signed)
   Subjective:    Patient ID: EUSEBIO BLAZEJEWSKI, male    DOB: [birth year], 61 y.o.   MRN: [id number]  HPI Here to follow up on HTN. His BP at home has been creeping up for the past few months, and lately it hangs around the 150s over 90s. 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 He exercises regularly and is conscious of his sodium intake, however he admits to drinking more alcohol lately than he ever has. He tends to ave 3 or 4 mixed drinks every night.    Review of Systems  Constitutional: Negative.   Respiratory: Negative.   Cardiovascular: Negative.   Neurological: Negative.        Objective:   Physical Exam  Constitutional: He is oriented to person, place, and time. He appears well-developed and well-nourished.  Cardiovascular: Normal rate, regular rhythm, normal heart sounds and intact distal pulses.   Pulmonary/Chest: Effort normal and breath sounds normal.  Musculoskeletal: He exhibits no edema.  Neurological: He is alert and oriented to person, place, and time.          Assessment & Plan:  HTN, we will add Amlodipine 5 mg daily to the Losartan. Set up fasting labs soon. We discussed his alcohol use and he agreed to cut back to 1 or 2 drinks nightly. Recheck in 3-4 weeks.  Alysia Penna, MD

## 2017-04-14 LAB — LIPID PANEL
CHOL/HDL RATIO: 3
Cholesterol: 195 mg/dL (ref 0–200)
HDL: 63.2 mg/dL (ref >39.00)
LDL Cholesterol: 116 mg/dL — ABNORMAL HIGH (ref 0–99)
NonHDL: 131.44
Triglycerides: 76 mg/dL (ref 0.0–149.0)
VLDL: 15.2 mg/dL (ref 0.0–40.0)

## 2017-04-14 LAB — BASIC METABOLIC PANEL
BUN: 17 mg/dL (ref 6–23)
CHLORIDE: 104 meq/L (ref 96–112)
CO2: 31 mEq/L (ref 19–32)
CREATININE: 1.01 mg/dL (ref 0.40–1.50)
Calcium: 9.1 mg/dL (ref 8.4–10.5)
GFR: 80.19 mL/min (ref >60.00)
Glucose, Bld: 86 mg/dL (ref 70–99)
POTASSIUM: 4.2 meq/L (ref 3.5–5.1)
SODIUM: 139 meq/L (ref 135–145)

## 2017-04-14 LAB — HEPATIC FUNCTION PANEL
ALT: 18 U/L (ref 0–53)
AST: 25 U/L (ref 0–37)
Albumin: 4 g/dL (ref 3.5–5.2)
Alkaline Phosphatase: 36 U/L — ABNORMAL LOW (ref 39–117)
Bilirubin, Direct: 0.2 mg/dL (ref 0.0–0.3)
Total Bilirubin: 0.8 mg/dL (ref 0.2–1.2)
Total Protein: 6.1 g/dL (ref 6.0–8.3)

## 2017-04-14 LAB — CBC WITH DIFFERENTIAL/PLATELET
BASOS ABS: 0 10*3/uL (ref 0.0–0.1)
Basophils Relative: 1 % (ref 0.0–3.0)
Eosinophils Absolute: 0.2 10*3/uL (ref 0.0–0.7)
Eosinophils Relative: 5.2 % — ABNORMAL HIGH (ref 0.0–5.0)
HCT: 42 % (ref 39.0–52.0)
Hemoglobin: 14 g/dL (ref 13.0–17.0)
LYMPHS PCT: 41.8 % (ref 12.0–46.0)
Lymphs Abs: 1.8 10*3/uL (ref 0.7–4.0)
MCHC: 33.3 g/dL (ref 30.0–36.0)
MCV: 96.6 fl (ref 78.0–100.0)
Monocytes Absolute: 0.5 10*3/uL (ref 0.1–1.0)
Monocytes Relative: 11.4 % (ref 3.0–12.0)
NEUTROS ABS: 1.7 10*3/uL (ref 1.4–7.7)
Neutrophils Relative %: 40.6 % — ABNORMAL LOW (ref 43.0–77.0)
Platelets: 217 10*3/uL (ref 150.0–400.0)
RBC: 4.34 Mil/uL (ref 4.22–5.81)
RDW: 15.1 % (ref 11.5–15.5)
WBC: 4.3 10*3/uL (ref 4.0–10.5)

## 2017-04-14 LAB — POC URINALSYSI DIPSTICK (AUTOMATED)
BILIRUBIN UA: NEGATIVE
Blood, UA: NEGATIVE
Glucose, UA: NEGATIVE
Ketones, UA: NEGATIVE
LEUKOCYTES UA: NEGATIVE
NITRITE UA: NEGATIVE
PH UA: 6 (ref 5.0–8.0)
PROTEIN UA: NEGATIVE
Spec Grav, UA: 1.015 (ref 1.010–1.025)
Urobilinogen, UA: 0.2 E.U./dL

## 2017-04-14 LAB — TSH: TSH: 3.94 u[IU]/mL (ref 0.35–4.50)

## 2017-04-14 LAB — PSA: PSA: 1.24 ng/mL (ref 0.10–4.00)

## 2017-04-14 NOTE — Addendum Note (Signed)
Addended by: Honor Loh L on: 04/14/2017 08:00 AM   Modules accepted: Orders

## 2017-04-15 ENCOUNTER — Encounter: Payer: Self-pay | Admitting: Family Medicine

## 2017-04-16 NOTE — Telephone Encounter (Signed)
As for the testosterone I would wait to see what Dr. Loanne Drilling says. As for the high LDL, I di not want to start him on a statin yet. Watch a strict diet and we can recheck a lipid panel in 90 days. As for the GFR, anything over 60 is normal so he should not worry about it. Drink plenty of water every day.

## 2017-04-20 ENCOUNTER — Encounter: Payer: Self-pay | Admitting: Family Medicine

## 2017-04-20 ENCOUNTER — Ambulatory Visit (INDEPENDENT_AMBULATORY_CARE_PROVIDER_SITE_OTHER): Payer: BC Managed Care – PPO | Admitting: Endocrinology

## 2017-04-20 ENCOUNTER — Encounter: Payer: Self-pay | Admitting: Endocrinology

## 2017-04-20 VITALS — BP 160/96 | HR 70 | Wt 180.0 lb

## 2017-04-20 DIAGNOSIS — E291 Testicular hypofunction: Secondary | ICD-10-CM | POA: Diagnosis not present

## 2017-04-20 NOTE — Patient Instructions (Addendum)
Please stop taking the testosterone, and: Please redo the blood tests in 2-3 months, at the [place] office.

## 2017-04-20 NOTE — Progress Notes (Signed)
Subjective:    Patient ID: Christopher [name], male    DOB: 09/05/1956, 61 y.o.   MRN: [id number]  HPI Pt is referred by Dr Sarajane Jews, for hypogonadism.  Pt reports he had puberty at the normal age.  He has 2 biological children, and does not wish to have any more.  He says he has never taken illicit androgens.  He has been on topical (the only type he has taken), testosterone since approx 2010.  He has never had w/u of the hypogonadism.  He does not take antiandrogens or opioids.  He denies any h/o infertility, XRT, or genital infection.  He has never had surgery, or a serious injury to the head or genital area. He has no h/o sleep apnea or DVT.   He consumes alcohol 2-3/day.  He presented with decreased muscle strength throughout the body, and assoc fatigue. since on it, he felt better initially, but not since.  He wants to raise the question as to whether or not he needs to continue it.    Past Medical History:  Diagnosis Date  . Colon polyps 09/26/2012   TUBULAR ADENOMA (X1)  . Depression   . Hypertension   . Insomnia     Past Surgical History:  Procedure Laterality Date  . COLONOSCOPY  09-26-12   per Dr. Henrene Pastor, adenomatous polyps, repeat in 5 yrs   . REPLACEMENT TOTAL KNEE     right  . ROTATOR CUFF REPAIR     right  . ROTATOR CUFF REPAIR     left  . WISDOM TOOTH EXTRACTION      Social History   Social History  . Marital status: Married    Spouse name: N/A  . Number of children: N/A  . Years of education: N/A   Occupational History  . Not on file.   Social History Main Topics  . Smoking status: Never Smoker  . Smokeless tobacco: Never Used  . Alcohol use 0.0 oz/week     Comment: 2-3 drinks every night  . Drug use: No  . Sexual activity: Not on file   Other Topics Concern  . Not on file   Social History Narrative  . No narrative on file    Current Outpatient Prescriptions on File Prior to Visit  Medication Sig Dispense Refill  . amLODipine (NORVASC) 5 MG tablet Take  1 tablet (5 mg total) by mouth daily. 90 tablet 3  . buPROPion (WELLBUTRIN XL) 300 MG 24 hr tablet TAKE ONE TABLET BY MOUTH DAILY 30 tablet 3  . fish oil-omega-3 fatty acids 1000 MG capsule Take 1 g by mouth daily.    Marland Kitchen LORazepam (ATIVAN) 0.5 MG tablet TAKE 1 TABLET BY MOUTH TWICE DAILY AS NEEDED 60 tablet 0  . losartan (COZAAR) 100 MG tablet Take 1 tablet (100 mg total) by mouth daily. 90 tablet 3   No current facility-administered medications on file prior to visit.     No Known Allergies  Family History  Problem Relation Age of Onset  . Hypertension Father   . Hypertension Brother   . Colon polyps Maternal Uncle   . Other Neg Hx        hypogonadism    BP (!) 160/96   Pulse 70   Wt 180 lb (81.6 kg)   SpO2 98%   BMI 24.41 kg/m    Review of Systems denies numbness, erectile dysfunction, weight change, decreased urinary stream, gynecomastia, recent muscle weakness, fever, headache, easy bruising, sob, rash, blurry vision,  rhinorrhea, chest pain.  Depression is well-controlled.      Objective:   Physical Exam VS: see vs page GEN: no distress HEAD: head: no deformity eyes: no periorbital swelling, no proptosis external nose and ears are normal mouth: no lesion seen NECK: supple, thyroid is not enlarged CHEST WALL: no deformity LUNGS: clear to auscultation BREASTS:  No gynecomastia CV: reg rate and rhythm, no murmur.   ABD: abdomen is soft, nontender.  no hepatosplenomegaly.  not distended.  no hernia GENITALIA:  Normal male, except testicles are small and soft MUSCULOSKELETAL: muscle bulk and strength are grossly normal.  no obvious joint swelling.  gait is normal and steady EXTEMITIES: no leg edema PULSES: no carotid bruit NEURO:  cn 2-12 grossly intact.   readily moves all 4's.  sensation is intact to touch on all 4's.   SKIN:  Normal texture and temperature.  No rash or suspicious lesion is visible.  Normal hair distribution.   NODES:  None palpable at the neck.     PSYCH: alert, well-oriented.  Does not appear anxious nor depressed.   Lab Results  Component Value Date   WBC 4.3 04/14/2017   HGB 14.0 04/14/2017   HCT 42.0 04/14/2017   MCV 96.6 04/14/2017   PLT 217.0 04/14/2017   Lab Results  Component Value Date   TESTOSTERONE 96.96 (L) 09/15/2016    Lab Results  Component Value Date   PSA 1.24 04/14/2017   PSA 1.17 06/18/2015   PSA 1.16 03/04/2012   I personally reviewed electrocardiogram tracing (06/18/15): Indication: wellness visit.  Impression: severe SB  No MI.  No hypertrophy.  Abnormal repolarization.  pulmonary disease pattern.  No comparison tracing is available.      Assessment & Plan:  Hypogonadism, new to me, uncertain etiology.  I agree with his wish to d/c rx to allow w/u.   HTN: with probable situational component. Please continue the same medication for now.   Patient Instructions  Please stop taking the testosterone, and: Please redo the blood tests in 2-3 months, at the [place] office.

## 2017-05-25 ENCOUNTER — Encounter: Payer: Self-pay | Admitting: Endocrinology

## 2017-05-27 ENCOUNTER — Encounter: Payer: Self-pay | Admitting: Family Medicine

## 2017-06-18 ENCOUNTER — Telehealth: Payer: Self-pay | Admitting: Family Medicine

## 2017-06-18 ENCOUNTER — Encounter: Payer: Self-pay | Admitting: Endocrinology

## 2017-06-18 NOTE — Telephone Encounter (Signed)
Pt is aware.  

## 2017-06-18 NOTE — Telephone Encounter (Addendum)
Pt has orders from Dr Loanne Drilling for labs and wants to come to our office to have those labs done. Is that ok?

## 2017-06-18 NOTE — Telephone Encounter (Signed)
No, these need to be drawn at the [place] [facility]

## 2017-06-22 ENCOUNTER — Other Ambulatory Visit (INDEPENDENT_AMBULATORY_CARE_PROVIDER_SITE_OTHER): Payer: BC Managed Care – PPO

## 2017-06-22 DIAGNOSIS — E291 Testicular hypofunction: Secondary | ICD-10-CM | POA: Diagnosis not present

## 2017-06-22 LAB — FOLLICLE STIMULATING HORMONE: FSH: 10.9 m[IU]/mL (ref 1.4–18.1)

## 2017-06-22 LAB — LUTEINIZING HORMONE: LH: 6.37 m[IU]/mL (ref 1.50–9.30)

## 2017-06-23 ENCOUNTER — Encounter: Payer: Self-pay | Admitting: Endocrinology

## 2017-06-23 LAB — PROLACTIN: Prolactin: 6.6 ng/mL (ref 2.0–18.0)

## 2017-06-23 LAB — TESTOSTERONE,FREE AND TOTAL
TESTOSTERONE FREE: 4.7 pg/mL — AB (ref 7.2–24.0)
Testosterone: 241 ng/dL — ABNORMAL LOW (ref 264–916)

## 2017-07-14 ENCOUNTER — Other Ambulatory Visit: Payer: Self-pay | Admitting: Family Medicine

## 2017-07-14 NOTE — Telephone Encounter (Signed)
Call in #60 with 2 rf 

## 2017-07-19 ENCOUNTER — Encounter: Payer: Self-pay | Admitting: Endocrinology

## 2017-07-21 ENCOUNTER — Other Ambulatory Visit: Payer: BC Managed Care – PPO

## 2017-07-23 ENCOUNTER — Other Ambulatory Visit: Payer: Self-pay | Admitting: Endocrinology

## 2017-07-23 DIAGNOSIS — E291 Testicular hypofunction: Secondary | ICD-10-CM

## 2017-07-23 DIAGNOSIS — Z125 Encounter for screening for malignant neoplasm of prostate: Secondary | ICD-10-CM

## 2017-07-26 ENCOUNTER — Telehealth: Payer: Self-pay | Admitting: Family Medicine

## 2017-07-26 ENCOUNTER — Other Ambulatory Visit (INDEPENDENT_AMBULATORY_CARE_PROVIDER_SITE_OTHER): Payer: BC Managed Care – PPO

## 2017-07-26 DIAGNOSIS — Z125 Encounter for screening for malignant neoplasm of prostate: Secondary | ICD-10-CM

## 2017-07-26 DIAGNOSIS — E291 Testicular hypofunction: Secondary | ICD-10-CM | POA: Diagnosis not present

## 2017-07-26 LAB — LUTEINIZING HORMONE: LH: 4.92 m[IU]/mL (ref 1.50–9.30)

## 2017-07-26 LAB — PSA: PSA: 1.04 ng/mL (ref 0.10–4.00)

## 2017-07-26 NOTE — Telephone Encounter (Signed)
Sent to PCP for alternative for losartan due to recall.

## 2017-07-26 NOTE — Telephone Encounter (Signed)
Copied from [place] [id number]. Topic: General - Other >> Jul 26, 2017 11:30 AM Darl Householder, RMA wrote: Reason for CRM: patient wanted to let Dr, Sarajane Jews know that there is a recall on medication Losartan and he has spoken with pharmacy and was told that he may need an alternative due to the Losartan may be discontinued, pls call pt

## 2017-07-27 ENCOUNTER — Encounter: Payer: Self-pay | Admitting: Endocrinology

## 2017-07-27 LAB — TESTOSTERONE,FREE AND TOTAL
TESTOSTERONE FREE: 6.5 pg/mL — AB (ref 7.2–24.0)
TESTOSTERONE: 303 ng/dL (ref 264–916)

## 2017-07-27 MED ORDER — VALSARTAN 80 MG PO TABS: 80.0000 mg | ORAL_TABLET | Freq: Every day | ORAL | 3 refills | Status: DC

## 2017-07-27 NOTE — Telephone Encounter (Signed)
E-scribed new Rx asking them to STOP losartan due to recall. Called pt and left a VM to inform pt new Rx was sent to pharmacy and advised to start Losartan.

## 2017-07-27 NOTE — Telephone Encounter (Signed)
Stop Losartan and call in Valsartan 80 mg daily, #90 with 3 rf 

## 2017-07-28 ENCOUNTER — Telehealth: Payer: Self-pay

## 2017-07-28 ENCOUNTER — Other Ambulatory Visit: Payer: Self-pay | Admitting: Endocrinology

## 2017-07-28 DIAGNOSIS — E291 Testicular hypofunction: Secondary | ICD-10-CM

## 2017-07-28 MED ORDER — METOPROLOL SUCCINATE ER 25 MG PO TB24: 25.0000 mg | ORAL_TABLET | Freq: Every day | ORAL | 3 refills | Status: DC

## 2017-07-28 NOTE — Telephone Encounter (Signed)
Fax from [facility] is on recall along with the losartan.  Will need to resent another option. Sent to PCP

## 2017-07-28 NOTE — Telephone Encounter (Signed)
Stop the Valsartan. Instead call in Metoprolol succinate 25 mg daily, #90 with 3 rf

## 2017-07-28 NOTE — Telephone Encounter (Signed)
Rx sent in for metoprolol and Valsartan has been removed due to recall along with losartan.

## 2017-07-28 NOTE — Addendum Note (Signed)
Addended by: Myriam Forehand on: 07/28/2017 05:39 PM   Modules accepted: Orders

## 2017-08-02 NOTE — Telephone Encounter (Signed)
Called pt and left a VM that we sent in a new BP medication. Advised him that both the valsartan and the losartan have been on recall. Metoprolol is our next option.

## 2017-10-06 ENCOUNTER — Other Ambulatory Visit: Payer: BC Managed Care – PPO

## 2017-10-06 DIAGNOSIS — E291 Testicular hypofunction: Secondary | ICD-10-CM

## 2017-10-07 LAB — TESTOSTERONE,FREE AND TOTAL
Testosterone, Free: 7 pg/mL (ref 6.6–18.1)
Testosterone: 322 ng/dL (ref 264–916)

## 2017-10-08 ENCOUNTER — Encounter: Payer: Self-pay | Admitting: Endocrinology

## 2017-11-03 ENCOUNTER — Encounter: Payer: Self-pay | Admitting: Internal Medicine

## 2017-11-15 ENCOUNTER — Encounter: Payer: Self-pay | Admitting: Internal Medicine

## 2017-12-28 DIAGNOSIS — M79671 Pain in right foot: Secondary | ICD-10-CM | POA: Insufficient documentation

## 2017-12-28 DIAGNOSIS — M79642 Pain in left hand: Secondary | ICD-10-CM | POA: Insufficient documentation

## 2018-01-03 ENCOUNTER — Encounter: Payer: Self-pay | Admitting: Internal Medicine

## 2018-01-03 ENCOUNTER — Ambulatory Visit (AMBULATORY_SURGERY_CENTER): Payer: Self-pay | Admitting: *Deleted

## 2018-01-03 ENCOUNTER — Other Ambulatory Visit: Payer: Self-pay

## 2018-01-03 VITALS — Ht 72.0 in | Wt 180.0 lb

## 2018-01-03 DIAGNOSIS — Z86010 Personal history of colonic polyps: Secondary | ICD-10-CM

## 2018-01-03 MED ORDER — NA SULFATE-K SULFATE-MG SULF 17.5-3.13-1.6 GM/177ML PO SOLN: ORAL | 0 refills | Status: DC

## 2018-01-03 NOTE — Progress Notes (Signed)
Patient denies any allergies to eggs or soy. Patient denies any problems with anesthesia/sedation. Patient denies any oxygen use at home. Patient denies taking any diet/weight loss medications or blood thinners. EMMI education declined by the pt.  

## 2018-01-17 ENCOUNTER — Ambulatory Visit (AMBULATORY_SURGERY_CENTER): Payer: BC Managed Care – PPO | Admitting: Internal Medicine

## 2018-01-17 ENCOUNTER — Encounter: Payer: Self-pay | Admitting: Internal Medicine

## 2018-01-17 ENCOUNTER — Other Ambulatory Visit: Payer: Self-pay

## 2018-01-17 VITALS — BP 135/91 | HR 55 | Temp 98.0°F | Resp 23 | Ht 72.0 in | Wt 180.0 lb

## 2018-01-17 DIAGNOSIS — Z1211 Encounter for screening for malignant neoplasm of colon: Secondary | ICD-10-CM | POA: Diagnosis not present

## 2018-01-17 DIAGNOSIS — Z860100 Personal history of colon polyps, unspecified: Secondary | ICD-10-CM

## 2018-01-17 DIAGNOSIS — D122 Benign neoplasm of ascending colon: Secondary | ICD-10-CM

## 2018-01-17 HISTORY — PX: COLONOSCOPY: SHX174

## 2018-01-17 MED ORDER — SODIUM CHLORIDE 0.9 % IV SOLN: 500.0000 mL | Freq: Once | INTRAVENOUS | Status: DC

## 2018-01-17 NOTE — Patient Instructions (Signed)
   Thank you for allowing us to care for you today!  Await pathology results by mail.  Handout given for Polyps.   YOU HAD AN ENDOSCOPIC PROCEDURE TODAY AT THE [facility]:   Refer to the procedure report that was given to you for any specific questions about what was found during the examination.  If the procedure report does not answer your questions, please call your gastroenterologist to clarify.  If you requested that your care partner not be given the details of your procedure findings, then the procedure report has been included in a sealed envelope for you to review at your convenience later.  YOU SHOULD EXPECT: Some feelings of bloating in the abdomen. Passage of more gas than usual.  Walking can help get rid of the air that was put into your GI tract during the procedure and reduce the bloating. If you had a lower endoscopy (such as a colonoscopy or flexible sigmoidoscopy) you may notice spotting of blood in your stool or on the toilet paper. If you underwent a bowel prep for your procedure, you may not have a normal bowel movement for a few days.  Please Note:  You might notice some irritation and congestion in your nose or some drainage.  This is from the oxygen used during your procedure.  There is no need for concern and it should clear up in a day or so.  SYMPTOMS TO REPORT IMMEDIATELY:   Following lower endoscopy (colonoscopy or flexible sigmoidoscopy):  Excessive amounts of blood in the stool  Significant tenderness or worsening of abdominal pains  Swelling of the abdomen that is new, acute  Fever of 100F or higher  For urgent or emergent issues, a gastroenterologist can be reached at any hour by calling [phone].   DIET:  We do recommend a small meal at first, but then you may proceed to your regular diet.  Drink plenty of fluids but you should avoid alcoholic beverages for 24 hours.  ACTIVITY:  You should plan to take it easy for the rest of today  and you should NOT DRIVE or use heavy machinery until tomorrow (because of the sedation medicines used during the test).    FOLLOW UP: Our staff will call the number listed on your records the next business day following your procedure to check on you and address any questions or concerns that you may have regarding the information given to you following your procedure. If we do not reach you, we will leave a message.  However, if you are feeling well and you are not experiencing any problems, there is no need to return our call.  We will assume that you have returned to your regular daily activities without incident.  If any biopsies were taken you will be contacted by phone or by letter within the next 1-3 weeks.  Please call us at [phone] if you have not heard about the biopsies in 3 weeks.    SIGNATURES/CONFIDENTIALITY: You and/or your care partner have signed paperwork which will be entered into your electronic medical record.  These signatures attest to the fact that that the information above on your After Visit Summary has been reviewed and is understood.  Full responsibility of the confidentiality of this discharge information lies with you and/or your care-partner. 

## 2018-01-17 NOTE — Op Note (Signed)
[facility] Patient Name: Christopher [name] Procedure Date: 01/17/2018 8:05 AM MRN: [id number] Endoscopist: Docia Chuck. Henrene Pastor , MD Age: 62 Referring MD:  Date of Birth: 09-15-1955 Gender: Male Account #: [id number] Procedure:                Colonoscopy, with cold snare polypectomy x 3 Indications:              High risk colon cancer surveillance: Personal                            history of non-advanced adenomas. Previous                            examinations 2005 and 2014 Medicines:                Monitored Anesthesia Care Procedure:                Pre-Anesthesia Assessment:                           - Prior to the procedure, a History and Physical                            was performed, and patient medications and                            allergies were reviewed. The patient's tolerance of                            previous anesthesia was also reviewed. The risks                            and benefits of the procedure and the sedation                            options and risks were discussed with the patient.                            All questions were answered, and informed consent                            was obtained. Prior Anticoagulants: The patient has                            taken no previous anticoagulant or antiplatelet                            agents. ASA Grade Assessment: II - A patient with                            mild systemic disease. After reviewing the risks                            and benefits, the patient was deemed in  satisfactory condition to undergo the procedure.                           After obtaining informed consent, the colonoscope                            was passed under direct vision. Throughout the                            procedure, the patient's blood pressure, pulse, and                            oxygen saturations were monitored continuously. The                            Colonoscope was  introduced through the anus and                            advanced to the the cecum, identified by                            appendiceal orifice and ileocecal valve. The                            ileocecal valve, appendiceal orifice, and rectum                            were photographed. The quality of the bowel                            preparation was adequate to identify polyps. The                            colonoscopy was performed without difficulty. The                            patient tolerated the procedure well. The bowel                            preparation used was SUPREP. Scope In: 8:18:14 AM Scope Out: 8:40:54 AM Scope Withdrawal Time: 0 hours 18 minutes 32 seconds  Total Procedure Duration: 0 hours 22 minutes 40 seconds  Findings:                 Three polyps were found in the ascending colon. The                            polyps were 2 to 5 mm in size. These polyps were                            removed with a cold snare. Resection and retrieval                            were complete.  A few small-mouthed diverticula were found in the                            sigmoid colon.                           Internal hemorrhoids were found during retroflexion.                           The exam was otherwise without abnormality on                            direct and retroflexion views. Complications:            No immediate complications. Estimated blood loss:                            None. Estimated Blood Loss:     Estimated blood loss: none. Impression:               - Three 2 to 5 mm polyps in the ascending colon,                            removed with a cold snare. Resected and retrieved.                           - Diverticulosis in the sigmoid colon.                           - Internal hemorrhoids.                           - The examination was otherwise normal on direct                            and retroflexion  views. Recommendation:           - Repeat colonoscopy in 5 years for surveillance.                            Avoid seeds and roughage prior to the preparation.                           - Patient has a contact number available for                            emergencies. The signs and symptoms of potential                            delayed complications were discussed with the                            patient. Return to normal activities tomorrow.                            Written discharge instructions were provided to the  patient.                           - Resume previous diet.                           - Continue present medications.                           - Await pathology results. Docia Chuck. Henrene Pastor, MD 01/17/2018 8:49:39 AM This report has been signed electronically.

## 2018-01-17 NOTE — Progress Notes (Signed)
Report to PACU, RN, vss, BBS= Clear.  

## 2018-01-17 NOTE — Progress Notes (Signed)
Called to room to assist during endoscopic procedure.  Patient ID and intended procedure confirmed with present staff. Received instructions for my participation in the procedure from the performing physician.  

## 2018-01-17 NOTE — Progress Notes (Signed)
Pt's states no medical or surgical changes since previsit or office visit. 

## 2018-01-18 ENCOUNTER — Telehealth: Payer: Self-pay | Admitting: *Deleted

## 2018-01-18 NOTE — Telephone Encounter (Signed)
  Follow up Call-  Call back number 01/17/2018  Post procedure Call Back phone  # [phone]  Permission to leave phone message Yes  Some recent data might be hidden     Patient questions:  Do you have a fever, pain , or abdominal swelling? No. Pain Score  0 *  Have you tolerated food without any problems? Yes.    Have you been able to return to your normal activities? Yes.    Do you have any questions about your discharge instructions: Diet   No. Medications  No. Follow up visit  No.  Do you have questions or concerns about your Care? No.  Actions: * If pain score is 4 or above: No action needed, pain <4.

## 2018-01-20 ENCOUNTER — Encounter: Payer: Self-pay | Admitting: Internal Medicine

## 2018-02-25 ENCOUNTER — Other Ambulatory Visit: Payer: Self-pay | Admitting: Family Medicine

## 2018-02-25 NOTE — Telephone Encounter (Signed)
Last OV 04/13/2017   Sent to PCP for approval

## 2018-02-25 NOTE — Telephone Encounter (Signed)
Call in #60 with 5 rf 

## 2018-03-04 ENCOUNTER — Telehealth: Payer: Self-pay | Admitting: *Deleted

## 2018-03-04 ENCOUNTER — Other Ambulatory Visit: Payer: Self-pay | Admitting: Podiatry

## 2018-03-04 ENCOUNTER — Encounter: Payer: Self-pay | Admitting: Podiatry

## 2018-03-04 ENCOUNTER — Ambulatory Visit (INDEPENDENT_AMBULATORY_CARE_PROVIDER_SITE_OTHER): Payer: BC Managed Care – PPO

## 2018-03-04 ENCOUNTER — Ambulatory Visit: Payer: BC Managed Care – PPO | Admitting: Podiatry

## 2018-03-04 DIAGNOSIS — M2011 Hallux valgus (acquired), right foot: Secondary | ICD-10-CM

## 2018-03-04 DIAGNOSIS — M2012 Hallux valgus (acquired), left foot: Secondary | ICD-10-CM

## 2018-03-04 DIAGNOSIS — M79671 Pain in right foot: Secondary | ICD-10-CM

## 2018-03-04 DIAGNOSIS — M2021 Hallux rigidus, right foot: Secondary | ICD-10-CM | POA: Diagnosis not present

## 2018-03-04 NOTE — Progress Notes (Signed)
   Subjective:    Patient ID: Christopher [name], male    DOB: Aug 27, 1956, 62 y.o.   MRN: [id number]  HPI    Review of Systems  All other systems reviewed and are negative.      Objective:   Physical Exam        Assessment & Plan:

## 2018-03-04 NOTE — Patient Instructions (Addendum)
Pre-Operative Instructions  Congratulations, you have decided to take an important step towards improving your quality of life.  You can be assured that the doctors and staff at [facility] will be with you every step of the way.  Here are some important things you should know:  1. Plan to be at the surgery center/hospital at least 1 (one) hour prior to your scheduled time, unless otherwise directed by the surgical center/hospital staff.  You must have a responsible adult accompany you, remain during the surgery and drive you home.  Make sure you have directions to the surgical center/hospital to ensure you arrive on time. 2. If you are having surgery at [facility] or [facility], you will need a copy of your medical history and physical form from your family physician within one month prior to the date of surgery. We will give you a form for your primary physician to complete.  3. We make every effort to accommodate the date you request for surgery.  However, there are times where surgery dates or times have to be moved.  We will contact you as soon as possible if a change in schedule is required.   4. No aspirin/ibuprofen for one week before surgery.  If you are on aspirin, any non-steroidal anti-inflammatory medications (Mobic, Aleve, Ibuprofen) should not be taken seven (7) days prior to your surgery.  You make take Tylenol for pain prior to surgery.  5. Medications - If you are taking daily heart and blood pressure medications, seizure, reflux, allergy, asthma, anxiety, pain or diabetes medications, make sure you notify the surgery center/hospital before the day of surgery so they can tell you which medications you should take or avoid the day of surgery. 6. No food or drink after midnight the night before surgery unless directed otherwise by surgical center/hospital staff. 7. No alcoholic beverages 24-hours prior to surgery.  No smoking 24-hours prior or 24-hours after  surgery. 8. Wear loose pants or shorts. They should be loose enough to fit over bandages, boots, and casts. 9. Don't wear slip-on shoes. Sneakers are preferred. 10. Bring your boot with you to the surgery center/hospital.  Also bring crutches or a walker if your physician has prescribed it for you.  If you do not have this equipment, it will be provided for you after surgery. 11. If you have not been contacted by the surgery center/hospital by the day before your surgery, call to confirm the date and time of your surgery. 12. Leave-time from work may vary depending on the type of surgery you have.  Appropriate arrangements should be made prior to surgery with your employer. 13. Prescriptions will be provided immediately following surgery by your doctor.  Fill these as soon as possible after surgery and take the medication as directed. Pain medications will not be refilled on weekends and must be approved by the doctor. 14. Remove nail polish on the operative foot and avoid getting pedicures prior to surgery. 15. Wash the night before surgery.  The night before surgery wash the foot and leg well with water and the antibacterial soap provided. Be sure to pay special attention to beneath the toenails and in between the toes.  Wash for at least three (3) minutes. Rinse thoroughly with water and dry well with a towel.  Perform this wash unless told not to do so by your physician.  Enclosed: 1 Ice pack (please put in freezer the night before surgery)   1 Hibiclens skin cleaner     Pre-op instructions  If you have any questions regarding the instructions, please do not hesitate to call our office.  [place]: [address], [place], [place] [address] -- [phone]  [place]: [address] [place], [place] [address] -- [phone]  [place]: [address]  [place], [place] [address] -- [phone]  [place]: [address], [address], [place], [place] [address] -- [phone]  Website:  [email or web address] Hallux Rigidus Hallux rigidus is a type of joint pain or joint disease (arthritis) that affects your big toe (hallux). This condition involves the joint that connects the base of your big toe to the main part of your foot (metatarsophalangeal joint). This condition can cause your big toe to become stiff, painful, and difficult to move. Symptoms may get worse with movement or in cold or damp weather. The condition also gets worse over time. What are the causes? This condition may be caused by having a foot that does not function the way that it should or has an abnormal shape (structural deformity). These foot problems can run in families (be hereditary). This condition can also be caused by:  Injury.  Overuse.  Certain inflammatory diseases, including gout and rheumatoid arthritis.  What increases the risk? This condition is more likely to develop in people who:  Have a foot bone (metatarsal) that is longer or higher than normal.  Have a family history of hallux rigidus.  Have previously injured their big toe.  Have feet that do not have a curve (arch) on the inner side of the foot. This may be called flat feet or fallen arches.  Turn their ankles in when they walk (pronation).  Have rheumatoid arthritis or gout.  Have to stoop down often at work.  What are the signs or symptoms? Symptoms of this condition include:  Big toe pain.  Stiffness and difficulty moving the big toe.  Swelling of the toe and surrounding area.  Bone spurs. These are bony growths that can form on the joint of the big toe.  A limp.  How is this diagnosed? This condition is diagnosed based on a medical history and physical exam. This may include X-rays. How is this treated? Treatment for this condition includes:  Wearing roomy, comfortable shoes that have a large toe box.  Putting orthotic devices in your shoes.  Pain medicines.  Physical therapy.  Icing the  injured area.  Alternate between putting your foot in cold water then warm water.  If your condition is severe, treatment may include:  Corticosteroid injections to relieve pain.  Surgery to remove bone spurs, fuse damaged bones together, or replace the entire joint.  Follow these instructions at home:  Take over-the-counter and prescription medicines only as told by your health care provider.  Do not wear high heels or other restrictive footwear. Wear comfortable, supportive shoes that have a large toe box.  Wear orthotics as told by your health care provider, if this applies.  Put your feet in cold water for 30 seconds, then in warm water for 30 seconds. Alternate between the cold and warm water for 5 minutes. Do this several times a day or as told by your health care provider.  If directed, apply ice to the injured area. ? Put ice in a plastic bag. ? Place a towel between your skin and the bag. ? Leave the ice on for 20 minutes, 2-3 times per day.  Do foot exercises as instructed by your health care provider or a physical therapist.  Keep all   follow-up visits as told by your health care provider. This is important. Contact a health care provider if:  You notice bone spurs or growths on or around your big toe.  Your pain does not get better or it gets worse.  You have pain while resting.  You have pain in other parts of your body, such as your back, hip, or knee.  You start to limp. This information is not intended to replace advice given to you by your health care provider. Make sure you discuss any questions you have with your health care provider. Document Released: 08/24/2005 Document Revised: 01/30/2016 Document Reviewed: 05/01/2015 Elsevier Interactive Patient Education  2018 Elsevier Inc.  

## 2018-03-04 NOTE — Progress Notes (Signed)
Subjective:   Patient ID: Christopher [name], male   DOB: 62 y.o.   MRN: [id number]   HPI Patient states I have developed increased pain in my right big toe joint over at least the last 6 months and I did drop something on it 4 months ago.  It is been a bother for a while but I have noticed it getting worse lately and I think I might have arthritis in it.  My left one is mildly this way but nowhere near like the right one   ROS      Objective:  Physical Exam  Neurovascular status intact with patient found to have reduced range of motion first MPJ right foot with inflammation fluid around the first metatarsal phalangeal joint with discomfort and pain with palpation.  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  I reviewed his x-rays with him and explained the length pattern mild elevation spurring and indications of narrowing of the joint surface.  At this point I reviewed different options including injection orthotics for possible decompression osteotomy and so far is opted for decompression osteotomy so I did allow him to read consent form for procedure going over at great length there is no guarantee as to the condition of the cartilage and that ultimately may require fusion or joint implantation procedure.  Patient is scheduled for outpatient surgery and is encouraged to call with any questions he may have an air fracture walker dispensed today.  I also discussed that we could delay this but I do not want to delay too long as I want to try to keep his cartilage healthy.  Patient will make a decision and be seen back on an as-needed basis  X-ray evaluation reveals moderate elongation first metatarsal segment right over left with dorsal spurring noted around the joint surface and mild  narrowness of the first MPJ right over left

## 2018-03-04 NOTE — Telephone Encounter (Signed)
"  I was in this morning, Friday June 28 to see Dr. Paulla Dolly.  We went ahead and scheduled a surgery for July 22.  I got home and I realized that is not going to work.  So I don't know if I need to talk to his nurse or somebody else.  I'm not going to be able to have the surgery until late Fall.  So I'd like to instead take option two that he gave me, which was a steroid injection. I want to see if I can come in for that.  I'm sorry for the change, I just jumped the gun this morning when I scheduled the July 22 surgery on my foot.  I'm not going to be able to do it then.  If is possible to get in as soon as I can get it will be great, sorry for the confusion.  I appreciate it."

## 2018-03-09 NOTE — Telephone Encounter (Signed)
I attempted to return his call.  I left him a message to give me a call back. 

## 2018-03-17 ENCOUNTER — Ambulatory Visit: Payer: BC Managed Care – PPO | Admitting: Podiatry

## 2018-03-17 ENCOUNTER — Encounter

## 2018-06-09 ENCOUNTER — Telehealth: Payer: Self-pay | Admitting: *Deleted

## 2018-06-09 NOTE — Telephone Encounter (Signed)
"  Hi Christopher [name], this is [name] calling.  I saw Dr. Paulla Dolly back in June about a potential surgery.  I just want to talk to you little bit about that and when I could possibly schedule.  He diagnosed me with Hallux Rigidus and he talked about a Bi-planar Osteotomy.  One of my main concerns is when I schedule it, how long will I be out of work and how long will I be in a walking boot.  I'd appreciate the call back and you can leave a message there."

## 2018-06-10 NOTE — Telephone Encounter (Signed)
I attempted to return his call.  I left him a message to give me a call on Monday or that I would give him a call.

## 2018-06-13 ENCOUNTER — Ambulatory Visit: Payer: BC Managed Care – PPO | Admitting: Family Medicine

## 2018-06-13 ENCOUNTER — Encounter: Payer: Self-pay | Admitting: Family Medicine

## 2018-06-13 VITALS — BP 146/90 | HR 81 | Temp 98.3°F | Wt 180.0 lb

## 2018-06-13 DIAGNOSIS — E291 Testicular hypofunction: Secondary | ICD-10-CM | POA: Diagnosis not present

## 2018-06-13 DIAGNOSIS — F1010 Alcohol abuse, uncomplicated: Secondary | ICD-10-CM | POA: Diagnosis not present

## 2018-06-13 DIAGNOSIS — I10 Essential (primary) hypertension: Secondary | ICD-10-CM

## 2018-06-13 DIAGNOSIS — Z125 Encounter for screening for malignant neoplasm of prostate: Secondary | ICD-10-CM | POA: Diagnosis not present

## 2018-06-13 MED ORDER — LOSARTAN POTASSIUM 50 MG PO TABS: 50.0000 mg | ORAL_TABLET | Freq: Every day | ORAL | 3 refills | Status: DC

## 2018-06-13 NOTE — Progress Notes (Signed)
   Subjective:    Patient ID: Christopher [name], male    DOB: [birth year], 62 y.o.   MRN: [id number]  HPI Here for several issues. First he is concerned about his BP. He took Losartan for awhile and did well on it, but when certain lots of this were recalled for safety he thought that all form were recalled. He then stopped taking it. We tired him on Amlodipine and Metoprolol but he stopped both of these because they made him feel tired. Lately he has felt fine but his BP tends to run in the 140s or 150s over 90s. Second he asks me to set up some fasting lab work to check his lipids, PSA, testosterone etc. He declines having a full wellness exam. Third he asks advice about excessive alcohol use. For years he has been consuming 3-4 alcoholic drinks every night, usually liquor. He says there are lots of alcoholics in his family, including his father. He has tried to cut back but cannot.    Review of Systems  Constitutional: Negative.   Respiratory: Negative.   Cardiovascular: Negative.   Neurological: Negative.   Psychiatric/Behavioral: Negative for dysphoric mood and sleep disturbance. The patient is not nervous/anxious.        Objective:   Physical Exam  Constitutional: He is oriented to person, place, and time. He appears well-developed and well-nourished.  Cardiovascular: Normal rate, regular rhythm, normal heart sounds and intact distal pulses.  Pulmonary/Chest: Effort normal and breath sounds normal. No stridor. No respiratory distress. He has no wheezes. He has no rales.  Neurological: He is alert and oriented to person, place, and time.  Psychiatric: He has a normal mood and affect. His behavior is normal. Thought content normal.          Assessment & Plan:  First, for the HTN he will get back on Losartan 50 mg daily. Recheck in one month. Second, we will set up fasting labs soon. Third for alcohol abuse I suggested he talk to a therapist and that he attend some [facility] meetings. He said  he will think about it.  Alysia Penna, MD

## 2018-06-14 ENCOUNTER — Other Ambulatory Visit: Payer: BC Managed Care – PPO

## 2018-06-14 LAB — POC URINALSYSI DIPSTICK (AUTOMATED)
Bilirubin, UA: NEGATIVE
Blood, UA: NEGATIVE
Glucose, UA: NEGATIVE
KETONES UA: NEGATIVE
LEUKOCYTES UA: NEGATIVE
Nitrite, UA: NEGATIVE
PROTEIN UA: NEGATIVE
Spec Grav, UA: 1.01 (ref 1.010–1.025)
Urobilinogen, UA: 0.2 E.U./dL
pH, UA: 7 (ref 5.0–8.0)

## 2018-06-14 LAB — TSH: TSH: 3.18 u[IU]/mL (ref 0.35–4.50)

## 2018-06-14 LAB — CBC WITH DIFFERENTIAL/PLATELET
Basophils Absolute: 0.1 10*3/uL (ref 0.0–0.1)
Basophils Relative: 1.4 % (ref 0.0–3.0)
EOS PCT: 6 % — AB (ref 0.0–5.0)
Eosinophils Absolute: 0.2 10*3/uL (ref 0.0–0.7)
HEMATOCRIT: 43 % (ref 39.0–52.0)
HEMOGLOBIN: 14.3 g/dL (ref 13.0–17.0)
LYMPHS PCT: 44.9 % (ref 12.0–46.0)
Lymphs Abs: 1.7 10*3/uL (ref 0.7–4.0)
MCHC: 33.3 g/dL (ref 30.0–36.0)
MCV: 93.9 fl (ref 78.0–100.0)
MONO ABS: 0.4 10*3/uL (ref 0.1–1.0)
Monocytes Relative: 9.8 % (ref 3.0–12.0)
Neutro Abs: 1.5 10*3/uL (ref 1.4–7.7)
Neutrophils Relative %: 37.9 % — ABNORMAL LOW (ref 43.0–77.0)
Platelets: 223 10*3/uL (ref 150.0–400.0)
RBC: 4.58 Mil/uL (ref 4.22–5.81)
RDW: 14 % (ref 11.5–15.5)
WBC: 3.9 10*3/uL — AB (ref 4.0–10.5)

## 2018-06-14 LAB — HEPATIC FUNCTION PANEL
ALK PHOS: 41 U/L (ref 39–117)
ALT: 19 U/L (ref 0–53)
AST: 25 U/L (ref 0–37)
Albumin: 4.3 g/dL (ref 3.5–5.2)
BILIRUBIN DIRECT: 0.2 mg/dL (ref 0.0–0.3)
Total Bilirubin: 0.9 mg/dL (ref 0.2–1.2)
Total Protein: 6.7 g/dL (ref 6.0–8.3)

## 2018-06-14 LAB — LIPID PANEL
CHOL/HDL RATIO: 3
Cholesterol: 208 mg/dL — ABNORMAL HIGH (ref 0–200)
HDL: 72 mg/dL (ref >39.00)
LDL Cholesterol: 123 mg/dL — ABNORMAL HIGH (ref 0–99)
NONHDL: 136.1
TRIGLYCERIDES: 65 mg/dL (ref 0.0–149.0)
VLDL: 13 mg/dL (ref 0.0–40.0)

## 2018-06-14 LAB — BASIC METABOLIC PANEL
BUN: 18 mg/dL (ref 6–23)
CALCIUM: 9.5 mg/dL (ref 8.4–10.5)
CO2: 30 meq/L (ref 19–32)
CREATININE: 0.93 mg/dL (ref 0.40–1.50)
Chloride: 103 mEq/L (ref 96–112)
GFR: 87.86 mL/min (ref >60.00)
Glucose, Bld: 92 mg/dL (ref 70–99)
Potassium: 4.4 mEq/L (ref 3.5–5.1)
Sodium: 139 mEq/L (ref 135–145)

## 2018-06-14 LAB — PSA: PSA: 0.93 ng/mL (ref 0.10–4.00)

## 2018-06-14 LAB — TESTOSTERONE: TESTOSTERONE: 286.77 ng/dL — AB (ref 300.00–890.00)

## 2018-06-14 NOTE — Addendum Note (Signed)
Addended by: Elmer Picker on: 06/14/2018 08:32 AM   Modules accepted: Orders

## 2018-06-14 NOTE — Telephone Encounter (Signed)
I am returning your call.  You want to schedule surgery with Dr. Paulla Dolly?  "No, I do not at this time."

## 2018-06-15 ENCOUNTER — Encounter: Payer: Self-pay | Admitting: *Deleted

## 2018-06-16 NOTE — Telephone Encounter (Signed)
Dr. Fry please advise. Thanks  

## 2018-06-17 NOTE — Telephone Encounter (Signed)
In all honesty, I think the abdominal fat has more to do with excessive alcohol use than with low testosterone. We spoke about him cutting back on this and increasing exercise.

## 2018-08-01 ENCOUNTER — Ambulatory Visit: Payer: Self-pay

## 2018-08-01 ENCOUNTER — Encounter: Payer: Self-pay | Admitting: Family Medicine

## 2018-08-01 NOTE — Telephone Encounter (Signed)
Pt scheduled for appt on 07/09/18 regarding current symptoms.    Patient called to say that he has been using OTC Aleve, and Ibuprofen for flare up of Sciatica on left side from Gluts down leg. States that he is willing to come in if doctor want him to. Requesting Rx sent to pharmacy for something stronger. Please advise.Christopher [name] Ph# [phone]

## 2018-08-02 ENCOUNTER — Ambulatory Visit: Payer: BC Managed Care – PPO | Admitting: Family Medicine

## 2018-08-02 NOTE — Telephone Encounter (Signed)
Dr. Fry please advise. Thanks  

## 2018-08-03 MED ORDER — METHYLPREDNISOLONE 4 MG PO TBPK: ORAL_TABLET | ORAL | 0 refills | Status: DC

## 2018-08-03 NOTE — Telephone Encounter (Signed)
Call in a Medrol dose pack  

## 2018-08-07 HISTORY — PX: TOE SURGERY: SHX1073

## 2018-08-16 ENCOUNTER — Other Ambulatory Visit: Payer: Self-pay

## 2018-08-16 ENCOUNTER — Encounter (HOSPITAL_BASED_OUTPATIENT_CLINIC_OR_DEPARTMENT_OTHER): Payer: Self-pay | Admitting: *Deleted

## 2018-08-19 ENCOUNTER — Encounter (HOSPITAL_BASED_OUTPATIENT_CLINIC_OR_DEPARTMENT_OTHER)
Admission: RE | Admit: 2018-08-19 | Discharge: 2018-08-19 | Disposition: A | Discharge status: Home / Self Care | Payer: BC Managed Care – PPO | Source: Ambulatory Visit | Attending: Orthopedic Surgery | Admitting: Orthopedic Surgery

## 2018-08-19 DIAGNOSIS — Z01810 Encounter for preprocedural cardiovascular examination: Secondary | ICD-10-CM | POA: Insufficient documentation

## 2018-08-24 NOTE — Anesthesia Preprocedure Evaluation (Addendum)
Anesthesia Evaluation  Patient identified by MRN, date of birth, ID band Patient awake    Reviewed: Allergy & Precautions, NPO status , Patient's Chart, lab work & pertinent test results  Airway Mallampati: II  TM Distance: >3 FB Neck ROM: Full    Dental  (+) Dental Advisory Given   Pulmonary neg pulmonary ROS,    breath sounds clear to auscultation       Cardiovascular hypertension, Pt. on medications  Rhythm:Regular Rate:Normal     Neuro/Psych negative neurological ROS     GI/Hepatic negative GI ROS, Neg liver ROS,   Endo/Other  negative endocrine ROS  Renal/GU negative Renal ROS     Musculoskeletal   Abdominal   Peds  Hematology negative hematology ROS (+)   Anesthesia Other Findings   Reproductive/Obstetrics                            Anesthesia Physical Anesthesia Plan  ASA: II  Anesthesia Plan: General   Post-op Pain Management:  Regional for Post-op pain   Induction: Intravenous  PONV Risk Score and Plan: 2 and Dexamethasone, Ondansetron and Treatment may vary due to age or medical condition  Airway Management Planned: LMA  Additional Equipment:   Intra-op Plan:   Post-operative Plan: Extubation in OR  Informed Consent: I have reviewed the patients History and Physical, chart, labs and discussed the procedure including the risks, benefits and alternatives for the proposed anesthesia with the patient or authorized representative who has indicated his/her understanding and acceptance.   Dental advisory given  Plan Discussed with: CRNA  Anesthesia Plan Comments:        Anesthesia Quick Evaluation

## 2018-08-25 ENCOUNTER — Encounter (HOSPITAL_BASED_OUTPATIENT_CLINIC_OR_DEPARTMENT_OTHER): Payer: Self-pay

## 2018-08-25 ENCOUNTER — Ambulatory Visit (HOSPITAL_BASED_OUTPATIENT_CLINIC_OR_DEPARTMENT_OTHER): Payer: BC Managed Care – PPO | Admitting: Anesthesiology

## 2018-08-25 ENCOUNTER — Encounter (HOSPITAL_BASED_OUTPATIENT_CLINIC_OR_DEPARTMENT_OTHER)
Admission: RE | Disposition: A | Discharge status: Home / Self Care | Payer: Self-pay | Source: Home / Self Care | Attending: Orthopedic Surgery

## 2018-08-25 ENCOUNTER — Ambulatory Visit (HOSPITAL_BASED_OUTPATIENT_CLINIC_OR_DEPARTMENT_OTHER)
Admission: RE | Admit: 2018-08-25 | Discharge: 2018-08-25 | Disposition: A | Discharge status: Home / Self Care | Payer: BC Managed Care – PPO | Attending: Orthopedic Surgery | Admitting: Orthopedic Surgery

## 2018-08-25 ENCOUNTER — Other Ambulatory Visit: Payer: Self-pay

## 2018-08-25 DIAGNOSIS — F329 Major depressive disorder, single episode, unspecified: Secondary | ICD-10-CM | POA: Diagnosis not present

## 2018-08-25 DIAGNOSIS — I10 Essential (primary) hypertension: Secondary | ICD-10-CM | POA: Diagnosis not present

## 2018-08-25 DIAGNOSIS — Z79899 Other long term (current) drug therapy: Secondary | ICD-10-CM | POA: Diagnosis not present

## 2018-08-25 DIAGNOSIS — M2021 Hallux rigidus, right foot: Secondary | ICD-10-CM | POA: Diagnosis present

## 2018-08-25 DIAGNOSIS — G4700 Insomnia, unspecified: Secondary | ICD-10-CM | POA: Diagnosis not present

## 2018-08-25 SURGERY — CHEILECTOMY, GREAT TOE, WITH IMPLANT INSERTION
Anesthesia: General | Site: Toe | Laterality: Right

## 2018-08-25 MED ORDER — CEFAZOLIN SODIUM-DEXTROSE 2-4 GM/100ML-% IV SOLN
2.0000 g | INTRAVENOUS | Status: AC
  Administered 2018-08-25: 2 g via INTRAVENOUS

## 2018-08-25 MED ORDER — ONDANSETRON HCL 4 MG/2ML IJ SOLN
INTRAMUSCULAR | Status: AC
  Filled 2018-08-25: qty 2

## 2018-08-25 MED ORDER — BUPIVACAINE-EPINEPHRINE (PF) 0.25% -1:200000 IJ SOLN
INTRAMUSCULAR | Status: AC
  Filled 2018-08-25: qty 90

## 2018-08-25 MED ORDER — DEXAMETHASONE SODIUM PHOSPHATE 4 MG/ML IJ SOLN
INTRAMUSCULAR | Status: DC | PRN
  Administered 2018-08-25: 10 mg via INTRAVENOUS

## 2018-08-25 MED ORDER — LIDOCAINE 2% (20 MG/ML) 5 ML SYRINGE
INTRAMUSCULAR | Status: AC
  Filled 2018-08-25: qty 5

## 2018-08-25 MED ORDER — OXYCODONE HCL 5 MG PO TABS: 5.0000 mg | ORAL_TABLET | ORAL | 0 refills | Status: AC | PRN

## 2018-08-25 MED ORDER — MIDAZOLAM HCL 2 MG/2ML IJ SOLN
INTRAMUSCULAR | Status: AC
  Filled 2018-08-25: qty 2

## 2018-08-25 MED ORDER — ROPIVACAINE HCL 5 MG/ML IJ SOLN
INTRAMUSCULAR | Status: DC | PRN
  Administered 2018-08-25: 20 mL via PERINEURAL

## 2018-08-25 MED ORDER — MIDAZOLAM HCL 2 MG/2ML IJ SOLN
1.0000 mg | INTRAMUSCULAR | Status: DC | PRN
  Administered 2018-08-25: 2 mg via INTRAVENOUS

## 2018-08-25 MED ORDER — BUPIVACAINE-EPINEPHRINE (PF) 0.5% -1:200000 IJ SOLN
INTRAMUSCULAR | Status: DC | PRN
  Administered 2018-08-25: 25 mL via PERINEURAL

## 2018-08-25 MED ORDER — FENTANYL CITRATE (PF) 100 MCG/2ML IJ SOLN
INTRAMUSCULAR | Status: AC
  Filled 2018-08-25: qty 2

## 2018-08-25 MED ORDER — CHLORHEXIDINE GLUCONATE 4 % EX LIQD: 60.0000 mL | Freq: Once | CUTANEOUS | Status: DC

## 2018-08-25 MED ORDER — FENTANYL CITRATE (PF) 100 MCG/2ML IJ SOLN
50.0000 ug | INTRAMUSCULAR | Status: DC | PRN
  Administered 2018-08-25: 25 ug via INTRAVENOUS
  Administered 2018-08-25: 50 ug via INTRAVENOUS

## 2018-08-25 MED ORDER — DOCUSATE SODIUM 100 MG PO CAPS: 100.0000 mg | ORAL_CAPSULE | Freq: Two times a day (BID) | ORAL | 0 refills | Status: DC

## 2018-08-25 MED ORDER — SCOPOLAMINE 1 MG/3DAYS TD PT72: 1.0000 | MEDICATED_PATCH | Freq: Once | TRANSDERMAL | Status: DC | PRN

## 2018-08-25 MED ORDER — ONDANSETRON HCL 4 MG/2ML IJ SOLN
INTRAMUSCULAR | Status: DC | PRN
  Administered 2018-08-25: 4 mg via INTRAVENOUS

## 2018-08-25 MED ORDER — BUPIVACAINE HCL (PF) 0.25 % IJ SOLN
INTRAMUSCULAR | Status: AC
  Filled 2018-08-25: qty 30

## 2018-08-25 MED ORDER — ONDANSETRON HCL 4 MG/2ML IJ SOLN: 4.0000 mg | Freq: Once | INTRAMUSCULAR | Status: DC | PRN

## 2018-08-25 MED ORDER — GLYCOPYRROLATE 0.2 MG/ML IJ SOLN
INTRAMUSCULAR | Status: DC | PRN
  Administered 2018-08-25: 0.2 mg via INTRAVENOUS

## 2018-08-25 MED ORDER — CEFAZOLIN SODIUM-DEXTROSE 2-4 GM/100ML-% IV SOLN
INTRAVENOUS | Status: AC
  Filled 2018-08-25: qty 100

## 2018-08-25 MED ORDER — PROPOFOL 500 MG/50ML IV EMUL
INTRAVENOUS | Status: AC
  Filled 2018-08-25: qty 50

## 2018-08-25 MED ORDER — PROPOFOL 10 MG/ML IV BOLUS
INTRAVENOUS | Status: DC | PRN
  Administered 2018-08-25: 200 mg via INTRAVENOUS
  Administered 2018-08-25: 50 mg via INTRAVENOUS

## 2018-08-25 MED ORDER — LACTATED RINGERS IV SOLN
INTRAVENOUS | Status: DC
  Administered 2018-08-25 (×2): via INTRAVENOUS

## 2018-08-25 MED ORDER — PROPOFOL 500 MG/50ML IV EMUL
INTRAVENOUS | Status: AC
  Filled 2018-08-25: qty 100

## 2018-08-25 MED ORDER — SENNA 8.6 MG PO TABS: 2.0000 | ORAL_TABLET | Freq: Two times a day (BID) | ORAL | 0 refills | Status: DC

## 2018-08-25 MED ORDER — SODIUM CHLORIDE 0.9 % IV SOLN: INTRAVENOUS | Status: DC

## 2018-08-25 MED ORDER — DEXAMETHASONE SODIUM PHOSPHATE 10 MG/ML IJ SOLN
INTRAMUSCULAR | Status: AC
  Filled 2018-08-25: qty 1

## 2018-08-25 SURGICAL SUPPLY — 57 items
BANDAGE ESMARK 6X9 LF (GAUZE/BANDAGES/DRESSINGS) IMPLANT
BLADE SURG 15 STRL LF DISP TIS (BLADE) ×2 IMPLANT
BLADE SURG 15 STRL SS (BLADE) ×2
BNDG COHESIVE 4X5 TAN STRL (GAUZE/BANDAGES/DRESSINGS) ×2 IMPLANT
BNDG CONFORM 2 STRL LF (GAUZE/BANDAGES/DRESSINGS) IMPLANT
BNDG CONFORM 3 STRL LF (GAUZE/BANDAGES/DRESSINGS) ×2 IMPLANT
BNDG ESMARK 4X9 LF (GAUZE/BANDAGES/DRESSINGS) IMPLANT
BNDG ESMARK 6X9 LF (GAUZE/BANDAGES/DRESSINGS)
CHLORAPREP W/TINT 26ML (MISCELLANEOUS) ×2 IMPLANT
COVER BACK TABLE 60X90IN (DRAPES) ×2 IMPLANT
COVER WAND RF STERILE (DRAPES) IMPLANT
CUFF TOURNIQUET SINGLE 34IN LL (TOURNIQUET CUFF) ×2 IMPLANT
DRAPE EXTREMITY T 121X128X90 (DRAPE) ×2 IMPLANT
DRAPE OEC MINIVIEW 54X84 (DRAPES) IMPLANT
DRAPE SURG 17X23 STRL (DRAPES) IMPLANT
DRAPE U-SHAPE 47X51 STRL (DRAPES) ×2 IMPLANT
DRSG MEPITEL 4X7.2 (GAUZE/BANDAGES/DRESSINGS) ×2 IMPLANT
DRSG PAD ABDOMINAL 8X10 ST (GAUZE/BANDAGES/DRESSINGS) ×2 IMPLANT
ELECT REM PT RETURN 9FT ADLT (ELECTROSURGICAL) ×2
ELECTRODE REM PT RTRN 9FT ADLT (ELECTROSURGICAL) ×1 IMPLANT
GAUZE SPONGE 4X4 12PLY STRL (GAUZE/BANDAGES/DRESSINGS) ×2 IMPLANT
GLOVE BIO SURGEON STRL SZ 6.5 (GLOVE) ×2 IMPLANT
GLOVE BIO SURGEON STRL SZ8 (GLOVE) ×2 IMPLANT
GLOVE BIOGEL PI IND STRL 7.0 (GLOVE) ×1 IMPLANT
GLOVE BIOGEL PI IND STRL 8 (GLOVE) ×2 IMPLANT
GLOVE BIOGEL PI INDICATOR 7.0 (GLOVE) ×1
GLOVE BIOGEL PI INDICATOR 8 (GLOVE) ×2
GLOVE ECLIPSE 8.0 STRL XLNG CF (GLOVE) ×2 IMPLANT
GOWN STRL REUS W/ TWL LRG LVL3 (GOWN DISPOSABLE) ×1 IMPLANT
GOWN STRL REUS W/ TWL XL LVL3 (GOWN DISPOSABLE) ×2 IMPLANT
GOWN STRL REUS W/TWL LRG LVL3 (GOWN DISPOSABLE) ×1
GOWN STRL REUS W/TWL XL LVL3 (GOWN DISPOSABLE) ×2
IMPL MTP CARTIVA 10MM (Orthopedic Implant) ×1 IMPLANT
IMPLANT MTP CARTIVA 10MM (Orthopedic Implant) ×2 IMPLANT
NEEDLE HYPO 25X1 1.5 SAFETY (NEEDLE) IMPLANT
NS IRRIG 1000ML POUR BTL (IV SOLUTION) ×2 IMPLANT
PACK BASIN DAY SURGERY FS (CUSTOM PROCEDURE TRAY) ×2 IMPLANT
PAD CAST 4YDX4 CTTN HI CHSV (CAST SUPPLIES) ×1 IMPLANT
PADDING CAST COTTON 4X4 STRL (CAST SUPPLIES) ×1
PENCIL BUTTON HOLSTER BLD 10FT (ELECTRODE) ×2 IMPLANT
SANITIZER HAND PURELL 535ML FO (MISCELLANEOUS) ×2 IMPLANT
SHEET MEDIUM DRAPE 40X70 STRL (DRAPES) ×2 IMPLANT
SLEEVE SCD COMPRESS KNEE MED (MISCELLANEOUS) ×2 IMPLANT
SPONGE LAP 18X18 RF (DISPOSABLE) ×2 IMPLANT
STOCKINETTE 6  STRL (DRAPES) ×1
STOCKINETTE 6 STRL (DRAPES) ×1 IMPLANT
SUCTION FRAZIER HANDLE 10FR (MISCELLANEOUS) ×1
SUCTION TUBE FRAZIER 10FR DISP (MISCELLANEOUS) ×1 IMPLANT
SUT ETHILON 3 0 PS 1 (SUTURE) ×2 IMPLANT
SUT MNCRL AB 3-0 PS2 18 (SUTURE) ×2 IMPLANT
SUT VIC AB 2-0 SH 27 (SUTURE) ×1
SUT VIC AB 2-0 SH 27XBRD (SUTURE) ×1 IMPLANT
SYR BULB 3OZ (MISCELLANEOUS) ×2 IMPLANT
SYR CONTROL 10ML LL (SYRINGE) IMPLANT
TOWEL GREEN STERILE FF (TOWEL DISPOSABLE) ×2 IMPLANT
TUBE CONNECTING 20X1/4 (TUBING) ×2 IMPLANT
UNDERPAD 30X30 (UNDERPADS AND DIAPERS) ×2 IMPLANT

## 2018-08-25 NOTE — Progress Notes (Signed)
Assisted Dr. Rob Fitzgerald with right, ultrasound guided, popliteal/saphenous block. Side rails up, monitors on throughout procedure. See vital signs in flow sheet. Tolerated Procedure well. 

## 2018-08-25 NOTE — Anesthesia Postprocedure Evaluation (Signed)
Anesthesia Post Note  Patient: Christopher [name]  Procedure(s) Performed: Right hallux metatarsal phalangeal joint cheilectomy and joint resurfacing (Right Toe)     Patient location during evaluation: PACU Anesthesia Type: General Level of consciousness: awake and alert Pain management: pain level controlled Vital Signs Assessment: post-procedure vital signs reviewed and stable Respiratory status: spontaneous breathing, nonlabored ventilation, respiratory function stable and patient connected to nasal cannula oxygen Cardiovascular status: blood pressure returned to baseline and stable Postop Assessment: no apparent nausea or vomiting Anesthetic complications: no    Last Vitals:  Vitals:   08/25/18 0858 08/25/18 0915  BP:  140/84  Pulse: (!) 54   Resp: (!) 21 18  Temp:  36.5 C  SpO2: 96% 99%    Last Pain:  Vitals:   08/25/18 0915  TempSrc:   PainSc: 0-No pain                 Tiajuana Amass

## 2018-08-25 NOTE — H&P (Signed)
Christopher [name] is an 62 y.o. male.   Chief Complaint: Right foot pain HPI: The patient is a 62 year old male with a long history of right forefoot pain due to hallux rigidus.  He has failed nonoperative treatment to date including activity modification, oral anti-inflammatories and shoewear modification.  He presents now for operative treatment of this painful condition.  Past Medical History:  Diagnosis Date  . Colon polyps 09/26/2012   TUBULAR ADENOMA (X1)  . Depression   . Hypertension   . Insomnia     Past Surgical History:  Procedure Laterality Date  . COLONOSCOPY  09-26-12   per Dr. Henrene Pastor, adenomatous polyps, repeat in 5 yrs   . EYE SURGERY     for glaucoma  . REPLACEMENT TOTAL KNEE     right  . ROTATOR CUFF REPAIR     right  . ROTATOR CUFF REPAIR     left  . WISDOM TOOTH EXTRACTION      Family History  Problem Relation Age of Onset  . Hypertension Father   . Hypertension Brother   . Colon polyps Maternal Uncle        famila polposis  . Other Neg Hx        hypogonadism  . Colon cancer Neg Hx    Social History:  reports that he has never smoked. He has never used smokeless tobacco. He reports current alcohol use of about 10.0 standard drinks of alcohol per week. He reports that he does not use drugs.  Allergies: No Known Allergies  Medications Prior to Admission  Medication Sig Dispense Refill  . fish oil-omega-3 fatty acids 1000 MG capsule Take 1 g by mouth daily.    Marland Kitchen LORazepam (ATIVAN) 0.5 MG tablet TAKE ONE TABLET BY MOUTH TWICE A DAY AS NEEDED 60 tablet 5  . losartan (COZAAR) 50 MG tablet Take 1 tablet (50 mg total) by mouth daily. 30 tablet 3    No results found for this or any previous visit (from the past 48 hour(s)). No results found.  ROS no recent fever, chills, nausea, vomiting or changes in his appetite  Blood pressure (!) 167/89, pulse (!) 57, temperature 97.6 F (36.4 C), temperature source Oral, resp. rate 14, height 6' (1.829 m), weight 81  kg, SpO2 100 %. Physical Exam  Well-nourished well-developed man in no apparent distress.  Alert and oriented x4.  Mood and affect are normal.  Extraocular motions are intact.  Respirations are unlabored.  Gait is normal.  The right hallux has decreased range of motion at the MP joint.  Skin is healthy and intact.  Pulses are palpable.  No lymphadenopathy.  5 out of 5 strength in plantar flexion and dorsiflexion of the ankle and toes.  Assessment/Plan Right hallux rigidus -to the operating room today for hallux MP joint cheilectomy and resurfacing.  The risks and benefits of the alternative treatment options have been discussed in detail.  The patient wishes to proceed with surgery and specifically understands risks of bleeding, infection, nerve damage, blood clots, need for additional surgery, amputation and death.   Wylene Simmer, MD 2018/09/19, 7:25 AM

## 2018-08-25 NOTE — Transfer of Care (Signed)
Immediate Anesthesia Transfer of Care Note  Patient: Christopher [name]  Procedure(s) Performed: Right hallux metatarsal phalangeal joint cheilectomy and joint resurfacing (Right Toe)  Patient Location: PACU  Anesthesia Type:General  Level of Consciousness: awake, alert  and oriented  Airway & Oxygen Therapy: Patient Spontanous Breathing  Post-op Assessment: Report given to RN and Post -op Vital signs reviewed and stable  Post vital signs: Reviewed and stable  Last Vitals:  Vitals Value Taken Time  BP 136/82 08/25/2018  8:20 AM  Temp    Pulse 85 08/25/2018  8:23 AM  Resp 11 08/25/2018  8:23 AM  SpO2 99 % 08/25/2018  8:23 AM  Vitals shown include unvalidated device data.  Last Pain:  Vitals:   08/25/18 0645  TempSrc: Oral  PainSc: 2       Patients Stated Pain Goal: 1 (53/66/44 0347)  Complications: No apparent anesthesia complications

## 2018-08-25 NOTE — Anesthesia Procedure Notes (Signed)
Procedure Name: LMA Insertion Date/Time: 08/25/2018 7:44 AM Performed by: Lyndee Leo, CRNA Pre-anesthesia Checklist: Patient identified, Emergency Drugs available, Suction available and Patient being monitored Patient Re-evaluated:Patient Re-evaluated prior to induction Oxygen Delivery Method: Circle system utilized Preoxygenation: Pre-oxygenation with 100% oxygen Induction Type: IV induction Ventilation: Mask ventilation without difficulty LMA: LMA inserted LMA Size: 4.0 Number of attempts: 1 Airway Equipment and Method: Bite block Placement Confirmation: positive ETCO2 Tube secured with: Tape Dental Injury: Teeth and Oropharynx as per pre-operative assessment

## 2018-08-25 NOTE — Anesthesia Procedure Notes (Signed)
Anesthesia Regional Block: Adductor canal block   Pre-Anesthetic Checklist: ,, timeout performed, Correct Patient, Correct Site, Correct Laterality, Correct Procedure, Correct Position, site marked, Risks and benefits discussed,  Surgical consent,  Pre-op evaluation,  At surgeon's request and post-op pain management  Laterality: Right  Prep: chloraprep       Needles:  Injection technique: Single-shot  Needle Type: Echogenic Needle     Needle Length: 9cm  Needle Gauge: 21     Additional Needles:   Procedures:,,,, ultrasound used (permanent image in chart),,,,  Narrative:  Start time: 08/25/2018 7:20 AM End time: 08/25/2018 7:26 AM Injection made incrementally with aspirations every 5 mL.  Performed by: Personally  Anesthesiologist: Suzette Battiest, MD

## 2018-08-25 NOTE — Op Note (Signed)
08/25/2018  8:15 AM  PATIENT:  Christopher [name]  62 y.o. male  PRE-OPERATIVE DIAGNOSIS: Right hallux rigidus  POST-OPERATIVE DIAGNOSIS: Same  Procedure(s):  Right hallux metatarsal phalangeal joint cheilectomy and joint resurfacing (10 mm Cartiva implant)  SURGEON:  Wylene Simmer, MD  ASSISTANT: Mechele Claude, PA-C  ANESTHESIA:   General, regional  EBL:  minimal   TOURNIQUET:   Total Tourniquet Time Documented: Thigh (Right) - 14 minutes Total: Thigh (Right) - 14 minutes  COMPLICATIONS:  None apparent  DISPOSITION:  Extubated, awake and stable to recovery.  INDICATION FOR PROCEDURE: The patient is a 62 year old male without significant past medical history.  He has a long history of right forefoot pain due to hallux rigidus.  He has failed nonoperative treatment to date and presents today for surgical correction of this painful condition.  The risks and benefits of the alternative treatment options have been discussed in detail.  The patient wishes to proceed with surgery and specifically understands risks of bleeding, infection, nerve damage, blood clots, need for additional surgery, amputation and death.  PROCEDURE IN DETAIL:  After pre operative consent was obtained, and the correct operative site was identified, the patient was brought to the operating room and placed supine on the OR table.  Anesthesia was administered.  Pre-operative antibiotics were administered.  A surgical timeout was taken.  The right lower extremity was prepped and draped in standard sterile fashion with a tourniquet around the thigh.  The extremity was elevated and the tourniquet was inflated to 250 mmHg.  A longitudinal incision was made over the hallux MP joint.  Dissection was carried down through the subcutaneous tissues.  The extensor houses longus and brevis tendons were identified and were retracted and protected throughout the case.  The dorsal joint capsule was incised and elevated medially and  laterally.  There was significant synovitis noted as well as osteophytes at the metatarsal head and the base of the proximal phalanx.  The osteophytes were removed with a rondure.  The collateral ligaments were released exposing the metatarsal head.  There was full-thickness cartilage loss over the dorsal lateral one third of the metatarsal head.  A guidepin was inserted just dorsally and laterally from the midpoint of the metatarsal head..  The 10 mm reamer was advanced over the guidepin and a socket was created for the implant.  The wound was irrigated copiously and the socket dried with suction.  The 10 mm implant was inserted without difficulty.  Joint was reduced.  Dorsiflexion was approximately 60 degrees and plantar flexion was 45 degrees.  The wound was again irrigated copiously.  The dorsal joint capsule was repaired with 2-0 Vicryl figure-of-eight sutures.  Subcutaneous tissues were approximated with inverted simple sutures of 3-0 Monocryl.  Running 3-0 nylon was used to close the skin incision.  Sterile dressings were applied followed by a compression wrap.  Tourniquet was released after application of the dressings.  The patient was awakened from anesthesia and transported to the recovery room in stable condition.  FOLLOW UP PLAN: Weightbearing as tolerated in a flat postop shoe.  Follow-up in the office in 2 weeks for suture removal.    Mechele Claude PA-C was present and scrubbed for the duration of the operative case. His assistance was essential in positioning the patient, prepping and draping, gaining and maintaining exposure, performing the operation, closing and dressing the wounds and applying the splint.

## 2018-08-25 NOTE — Anesthesia Procedure Notes (Signed)
Anesthesia Regional Block: Popliteal block   Pre-Anesthetic Checklist: ,, timeout performed, Correct Patient, Correct Site, Correct Laterality, Correct Procedure, Correct Position, site marked, Risks and benefits discussed,  Surgical consent,  Pre-op evaluation,  At surgeon's request and post-op pain management  Laterality: Right  Prep: chloraprep       Needles:  Injection technique: Single-shot  Needle Type: Echogenic Needle     Needle Length: 9cm  Needle Gauge: 21     Additional Needles:   Procedures:,,,, ultrasound used (permanent image in chart),,,,  Narrative:  Start time: 08/25/2018 7:15 AM End time: 08/25/2018 7:20 AM Injection made incrementally with aspirations every 5 mL.  Performed by: Personally  Anesthesiologist: Suzette Battiest, MD

## 2018-08-25 NOTE — Discharge Instructions (Addendum)
Christopher Simmer, MD [place]  Please read the following information regarding your care after surgery.  Medications  You only need a prescription for the narcotic pain medicine (ex. oxycodone, Percocet, Norco).  All of the other medicines listed below are available over the counter. X Aleve 2 pills twice a day for the first 3 days after surgery. XX acetominophen (Tylenol) 650 mg every 4-6 hours as you need for minor to moderate pain X oxycodone as prescribed for severe pain  Narcotic pain medicine (ex. oxycodone, Percocet, Vicodin) will cause constipation.  To prevent this problem, take the following medicines while you are taking any pain medicine. X docusate sodium (Colace) 100 mg twice a day X senna (Senokot) 2 tablets twice a day   Weight Bearing X Bear weight only on your operated foot in the post-op shoe.   Cast / Splint / Dressing X Keep your splint, cast or dressing clean and dry.  Dont put anything (coat hanger, pencil, etc) down inside of it.  If it gets damp, use a hair dryer on the cool setting to dry it.  If it gets soaked, call the office to schedule an appointment for a cast change.   After your dressing, cast or splint is removed; you may shower, but do not soak or scrub the wound.  Allow the water to run over it, and then gently pat it dry.  Swelling It is normal for you to have swelling where you had surgery.  To reduce swelling and pain, keep your toes above your nose for at least 3 days after surgery.  It may be necessary to keep your foot or leg elevated for several weeks.  If it hurts, it should be elevated.  Follow Up Call my office at [phone] when you are discharged from the hospital or surgery center to schedule an appointment to be seen two weeks after surgery.  Call my office at [phone] if you develop a fever >101.5 F, nausea, vomiting, bleeding from the surgical site or severe pain.       Regional Anesthesia Blocks  1. Numbness  or the inability to move the "blocked" extremity may last from 3-48 hours after placement. The length of time depends on the medication injected and your individual response to the medication. If the numbness is not going away after 48 hours, call your surgeon.  2. The extremity that is blocked will need to be protected until the numbness is gone and the  Strength has returned. Because you cannot feel it, you will need to take extra care to avoid injury. Because it may be weak, you may have difficulty moving it or using it. You may not know what position it is in without looking at it while the block is in effect.  3. For blocks in the legs and feet, returning to weight bearing and walking needs to be done carefully. You will need to wait until the numbness is entirely gone and the strength has returned. You should be able to move your leg and foot normally before you try and bear weight or walk. You will need someone to be with you when you first try to ensure you do not fall and possibly risk injury.  4. Bruising and tenderness at the needle site are common side effects and will resolve in a few days.  5. Persistent numbness or new problems with movement should be communicated to the surgeon or the [place] [phone] [place] [phone]).  Post Anesthesia Home Care Instructions  Activity: Get plenty of rest for the remainder of the day. A responsible individual must stay with you for 24 hours following the procedure.  For the next 24 hours, DO NOT: -Drive a car -Paediatric nurse -Drink alcoholic beverages -Take any medication unless instructed by your physician -Make any legal decisions or sign important papers.  Meals: Start with liquid foods such as gelatin or soup. Progress to regular foods as tolerated. Avoid greasy, spicy, heavy foods. If nausea and/or vomiting occur, drink only clear liquids until the nausea and/or vomiting subsides.  Call your physician if vomiting continues.  Special Instructions/Symptoms: Your throat may feel dry or sore from the anesthesia or the breathing tube placed in your throat during surgery. If this causes discomfort, gargle with warm salt water. The discomfort should disappear within 24 hours.  If you had a scopolamine patch placed behind your ear for the management of post- operative nausea and/or vomiting:  1. The medication in the patch is effective for 72 hours, after which it should be removed.  Wrap patch in a tissue and discard in the trash. Wash hands thoroughly with soap and water. 2. You may remove the patch earlier than 72 hours if you experience unpleasant side effects which may include dry mouth, dizziness or visual disturbances. 3. Avoid touching the patch. Wash your hands with soap and water after contact with the patch.

## 2018-08-26 NOTE — Addendum Note (Signed)
Addendum  created 08/26/18 4401 by Viktor Philipp, Ernesta Amble, CRNA   Charge Capture section accepted

## 2018-10-11 ENCOUNTER — Other Ambulatory Visit: Payer: Self-pay | Admitting: Family Medicine

## 2018-10-11 NOTE — Telephone Encounter (Signed)
Dr. Fry please advise on refill. Thanks  

## 2018-10-13 NOTE — Telephone Encounter (Signed)
Call in #60 with 5 rf 

## 2018-10-17 NOTE — Telephone Encounter (Signed)
rx has been called to the pharmacy  

## 2018-10-18 ENCOUNTER — Encounter: Payer: Self-pay | Admitting: Family Medicine

## 2018-10-18 ENCOUNTER — Ambulatory Visit: Payer: BC Managed Care – PPO | Admitting: Family Medicine

## 2018-10-18 VITALS — BP 110/62 | HR 53 | Temp 98.0°F | Wt 173.6 lb

## 2018-10-18 DIAGNOSIS — I10 Essential (primary) hypertension: Secondary | ICD-10-CM

## 2018-10-18 NOTE — Progress Notes (Signed)
   Subjective:    Patient ID: EZEKIAH MASSIE, male    DOB: 22-Nov-1955, 63 y.o.   MRN: [id number]  HPI Here to discuss his BP. He has a wrist cuff at home and he as been consistently getting readings of 160s over 90s. He feels fine. He has decreased his alcohol intake and he has lost about 8 lbs.    Review of Systems  Constitutional: Negative.   Respiratory: Negative.   Cardiovascular: Negative.   Neurological: Negative.        Objective:   Physical Exam Constitutional:      Appearance: Normal appearance.  Cardiovascular:     Rate and Rhythm: Normal rate.     Pulses: Normal pulses.     Heart sounds: Normal heart sounds.  Pulmonary:     Effort: Pulmonary effort is normal.     Breath sounds: Normal breath sounds.  Neurological:     General: No focal deficit present.     Mental Status: He is alert and oriented to person, place, and time.           Assessment & Plan:  I reassured him that his BP is actually well controlled. I told him to get rid of his wrist cuff because these are notoriously inaccurate. He will purchase an arm cuff and follow up prn.  Alysia Penna, MD

## 2018-11-04 ENCOUNTER — Other Ambulatory Visit: Payer: Self-pay | Admitting: Family Medicine

## 2018-11-10 ENCOUNTER — Other Ambulatory Visit: Payer: Self-pay | Admitting: Family Medicine

## 2018-11-10 MED ORDER — LOSARTAN POTASSIUM 100 MG PO TABS: ORAL_TABLET | ORAL | 3 refills | Status: DC

## 2018-11-10 NOTE — Telephone Encounter (Signed)
Changed the losartan 50 mg as we received the fax from the pharmacy stating that this is on long term backorder.  New rx has been sent in to the pharmacy.

## 2019-01-10 ENCOUNTER — Telehealth: Payer: Self-pay | Admitting: Family Medicine

## 2019-01-10 NOTE — Telephone Encounter (Signed)
Pt dropped off [facility] [employer] forms to be completed by the provider.  Upon completion pt would like to be called at [phone] to pick the forms up to take to the Orthopedics.  Forms were placed in providers folder for completion.

## 2019-01-12 NOTE — Telephone Encounter (Signed)
Papers placed in the red folder for completion.  Please advise. Thanks

## 2019-01-16 DIAGNOSIS — Z0279 Encounter for issue of other medical certificate: Secondary | ICD-10-CM

## 2019-01-16 NOTE — Telephone Encounter (Signed)
I have called the pt and left a VM to make him aware that forms are ready to be picked up.  These have been placed up front.

## 2019-01-16 NOTE — Telephone Encounter (Signed)
The form is ready  

## 2019-02-13 NOTE — Progress Notes (Signed)
Please place orders in Epic as patient is being scheduled for a pre-op appointment! Thank you! 

## 2019-02-21 ENCOUNTER — Ambulatory Visit: Payer: Self-pay | Admitting: Orthopedic Surgery

## 2019-02-21 NOTE — Progress Notes (Signed)
NEED ORDERS IN Epic FOR 6-24 SURGERY PRE OP IS 02-22-19

## 2019-02-21 NOTE — Patient Instructions (Addendum)
YOU ARE REQUIRED TO BE TESTED FOR COVID-19 PRIOR TO YOUR SURGERY . YOUR TEST MUST BE COMPLETED ON Saturday June 20th. TESTING IS LOCATED AT [place] ENTRANCE FROM 9:00AM - 12:00PM. FAILURE TO COMPLETE TESTING MAY RESULT IN CANCELLATION OF YOUR SURGERY.  ONCE YOUR COVID TEST IS COMPLETED, PLEASE BEGIN THE QUARANTINE INSTRUCTIONS AS OUTLINED IN YOUR HANDOUT.                 Christopher [name]     Your procedure is scheduled on: 03-01-2019    Report to [facility] Main  Entrance    Report to admitting at  8:10 AM      Call this number if you have problems the morning of surgery [phone]    Remember: Do not eat food After Midnight. YOU MAY HAVE CLEAR LIQUIDS FROM MIDNIGHT UNTIL 5:00AM.  At 5:00AM Please finish the prescribed Pre-Surgery ENSURE drink. Nothing by mouth after you finish the ENSURE drink !    BRUSH YOUR TEETH MORNING OF SURGERY AND RINSE YOUR MOUTH OUT, NO CHEWING GUM CANDY OR MINTS.      CLEAR LIQUID DIET   Foods Allowed                                                                     Foods Excluded  Coffee and tea, regular and decaf                             liquids that you cannot  Plain Jell-O in any flavor                                             see through such as: Fruit ices (not with fruit pulp)                                     milk, soups, orange juice  Iced Popsicles                                    All solid food Carbonated beverages, regular and diet                                    Cranberry, grape and apple juices Sports drinks like Gatorade Lightly seasoned clear broth or consume(fat free) Sugar, honey syrup  Sample Menu Breakfast                                Lunch                                     Supper Cranberry juice  Beef broth                            Chicken broth Jell-O                                     Grape juice                           Apple juice Coffee  or tea                        Jell-O                                      Popsicle                                                Coffee or tea                        Coffee or tea  _____________________________________________________________________       Take these medicines the morning of surgery with A SIP OF WATER: NONE                                You may not have any metal on your body including hair pins and              piercings  Do not wear jewelry, make-up, lotions, powders or perfumes, deodorant                          Men may shave face and neck.   Do not bring valuables to the hospital. [place].  Contacts, dentures or bridgework may not be worn into surgery.  Leave suitcase in the car. After surgery it may be brought to your room.     _____________________________________________________________________             [facility] - Preparing for Surgery Before surgery, you can play an important role.  Because skin is not sterile, your skin needs to be as free of germs as possible.  You can reduce the number of germs on your skin by washing with CHG (chlorahexidine gluconate) soap before surgery.  CHG is an antiseptic cleaner which kills germs and bonds with the skin to continue killing germs even after washing. Please DO NOT use if you have an allergy to CHG or antibacterial soaps.  If your skin becomes reddened/irritated stop using the CHG and inform your nurse when you arrive at Short Stay. Do not shave (including legs and underarms) for at least 48 hours prior to the first CHG shower.  You may shave your face/neck. Please follow these instructions carefully:  1.  Shower with CHG Soap the night before surgery and the  morning of Surgery.  2.  If you choose to wash your hair, wash your hair first  as usual with your  normal  shampoo.  3.  After you shampoo, rinse your hair and body thoroughly to remove the  shampoo.                            4.  Use CHG as you would any other liquid soap.  You can apply chg directly  to the skin and wash                       Gently with a scrungie or clean washcloth.  5.  Apply the CHG Soap to your body ONLY FROM THE NECK DOWN.   Do not use on face/ open                           Wound or open sores. Avoid contact with eyes, ears mouth and genitals (private parts).                       Wash face,  Genitals (private parts) with your normal soap.             6.  Wash thoroughly, paying special attention to the area where your surgery  will be performed.  7.  Thoroughly rinse your body with warm water from the neck down.  8.  DO NOT shower/wash with your normal soap after using and rinsing off  the CHG Soap.                9.  Pat yourself dry with a clean towel.            10.  Wear clean pajamas.            11.  Place clean sheets on your bed the night of your first shower and do not  sleep with pets. Day of Surgery : Do not apply any lotions/deodorants the morning of surgery.  Please wear clean clothes to the hospital/surgery center.  FAILURE TO FOLLOW THESE INSTRUCTIONS MAY RESULT IN THE CANCELLATION OF YOUR SURGERY PATIENT SIGNATURE_________________________________  NURSE SIGNATURE__________________________________  ________________________________________________________________________     [name]  An incentive spirometer is a tool that can help keep your lungs clear and active. This tool measures how well you are filling your lungs with each breath. Taking long deep breaths may help reverse or decrease the chance of developing breathing (pulmonary) problems (especially infection) following:  A long period of time when you are unable to move or be active. BEFORE THE PROCEDURE   If the spirometer includes an indicator to show your best effort, your nurse or respiratory therapist will set it to a desired goal.  If possible, sit up straight or  lean slightly forward. Try not to slouch.  Hold the incentive spirometer in an upright position. INSTRUCTIONS FOR USE  1. Sit on the edge of your bed if possible, or sit up as far as you can in bed or on a chair. 2. Hold the incentive spirometer in an upright position. 3. Breathe out normally. 4. Place the mouthpiece in your mouth and seal your lips tightly around it. 5. Breathe in slowly and as deeply as possible, raising the piston or the ball toward the top of the column. 6. Hold your breath for 3-5 seconds or for as long as possible. Allow the piston or ball to fall to the bottom of the column. 7.  Remove the mouthpiece from your mouth and breathe out normally. 8. Rest for a few seconds and repeat Steps 1 through 7 at least 10 times every 1-2 hours when you are awake. Take your time and take a few normal breaths between deep breaths. 9. The spirometer may include an indicator to show your best effort. Use the indicator as a goal to work toward during each repetition. 10. After each set of 10 deep breaths, practice coughing to be sure your lungs are clear. If you have an incision (the cut made at the time of surgery), support your incision when coughing by placing a pillow or rolled up towels firmly against it. Once you are able to get out of bed, walk around indoors and cough well. You may stop using the incentive spirometer when instructed by your caregiver.  RISKS AND COMPLICATIONS  Take your time so you do not get dizzy or light-headed.  If you are in pain, you may need to take or ask for pain medication before doing incentive spirometry. It is harder to take a deep breath if you are having pain. AFTER USE  Rest and breathe slowly and easily.  It can be helpful to keep track of a log of your progress. Your caregiver can provide you with a simple table to help with this. If you are using the spirometer at home, follow these instructions: [place] IF:   You are having  difficultly using the spirometer.  You have trouble using the spirometer as often as instructed.  Your pain medication is not giving enough relief while using the spirometer.  You develop fever of 100.5 F (38.1 C) or higher. SEEK IMMEDIATE MEDICAL CARE IF:   You cough up bloody sputum that had not been present before.  You develop fever of 102 F (38.9 C) or greater.  You develop worsening pain at or near the incision site. MAKE SURE YOU:   Understand these instructions.  Will watch your condition.  Will get help right away if you are not doing well or get worse. Document Released: 01/04/2007 Document Revised: 11/16/2011 Document Reviewed: 03/07/2007 Exit[employer] Patient Information 2014 [employer], [place].   ________________________________________________________________________  WHAT IS A BLOOD TRANSFUSION? Blood Transfusion Information  A transfusion is the replacement of blood or some of its parts. Blood is made up of multiple cells which provide different functions.  Red blood cells carry oxygen and are used for blood loss replacement.  White blood cells fight against infection.  Platelets control bleeding.  Plasma helps clot blood.  Other blood products are available for specialized needs, such as hemophilia or other clotting disorders. BEFORE THE TRANSFUSION  Who gives blood for transfusions?   Healthy volunteers who are fully evaluated to make sure their blood is safe. This is blood bank blood. Transfusion therapy is the safest it has ever been in the practice of medicine. Before blood is taken from a donor, a complete history is taken to make sure that person has no history of diseases nor engages in risky social behavior (examples are intravenous drug use or sexual activity with multiple partners). The donor's travel history is screened to minimize risk of transmitting infections, such as malaria. The donated blood is tested for signs of infectious diseases, such as  HIV and hepatitis. The blood is then tested to be sure it is compatible with you in order to minimize the chance of a transfusion reaction. If you or a relative donates blood, this is often done in anticipation  of surgery and is not appropriate for emergency situations. It takes many days to process the donated blood. RISKS AND COMPLICATIONS Although transfusion therapy is very safe and saves many lives, the main dangers of transfusion include:   Getting an infectious disease.  Developing a transfusion reaction. This is an allergic reaction to something in the blood you were given. Every precaution is taken to prevent this. The decision to have a blood transfusion has been considered carefully by your caregiver before blood is given. Blood is not given unless the benefits outweigh the risks. AFTER THE TRANSFUSION  Right after receiving a blood transfusion, you will usually feel much better and more energetic. This is especially true if your red blood cells have gotten low (anemic). The transfusion raises the level of the red blood cells which carry oxygen, and this usually causes an energy increase.  The nurse administering the transfusion will monitor you carefully for complications. HOME CARE INSTRUCTIONS  No special instructions are needed after a transfusion. You may find your energy is better. Speak with your caregiver about any limitations on activity for underlying diseases you may have. SEEK MEDICAL CARE IF:   Your condition is not improving after your transfusion.  You develop redness or irritation at the intravenous (IV) site. SEEK IMMEDIATE MEDICAL CARE IF:  Any of the following symptoms occur over the next 12 hours:  Shaking chills.  You have a temperature by mouth above 102 F (38.9 C), not controlled by medicine.  Chest, back, or muscle pain.  People around you feel you are not acting correctly or are confused.  Shortness of breath or difficulty breathing.  Dizziness  and fainting.  You get a rash or develop hives.  You have a decrease in urine output.  Your urine turns a dark color or changes to pink, red, or brown. Any of the following symptoms occur over the next 10 days:  You have a temperature by mouth above 102 F (38.9 C), not controlled by medicine.  Shortness of breath.  Weakness after normal activity.  The white part of the eye turns yellow (jaundice).  You have a decrease in the amount of urine or are urinating less often.  Your urine turns a dark color or changes to pink, red, or brown. Document Released: 08/21/2000 Document Revised: 11/16/2011 Document Reviewed: 04/09/2008 [facility] Patient Information 2014 [place], [place].  _______________________________________________________________________

## 2019-02-22 ENCOUNTER — Other Ambulatory Visit: Payer: Self-pay

## 2019-02-22 ENCOUNTER — Encounter (HOSPITAL_COMMUNITY)
Admission: RE | Admit: 2019-02-22 | Discharge: 2019-02-22 | Disposition: A | Discharge status: Home / Self Care | Payer: BC Managed Care – PPO | Source: Ambulatory Visit | Attending: Orthopedic Surgery | Admitting: Orthopedic Surgery

## 2019-02-22 ENCOUNTER — Encounter (HOSPITAL_COMMUNITY): Payer: Self-pay

## 2019-02-22 DIAGNOSIS — Z01812 Encounter for preprocedural laboratory examination: Secondary | ICD-10-CM | POA: Diagnosis not present

## 2019-02-22 DIAGNOSIS — M1712 Unilateral primary osteoarthritis, left knee: Secondary | ICD-10-CM | POA: Diagnosis not present

## 2019-02-22 HISTORY — DX: Unspecified osteoarthritis, unspecified site: M19.90

## 2019-02-22 LAB — COMPREHENSIVE METABOLIC PANEL
ALT: 19 U/L (ref 0–44)
AST: 23 U/L (ref 15–41)
Albumin: 4.2 g/dL (ref 3.5–5.0)
Alkaline Phosphatase: 39 U/L (ref 38–126)
Anion gap: 10 (ref 5–15)
BUN: 16 mg/dL (ref 8–23)
CO2: 26 mmol/L (ref 22–32)
Calcium: 9.6 mg/dL (ref 8.9–10.3)
Chloride: 107 mmol/L (ref 98–111)
Creatinine, Ser: 0.74 mg/dL (ref 0.61–1.24)
GFR calc Af Amer: >60 mL/min (ref >60)
GFR calc non Af Amer: >60 mL/min (ref >60)
Glucose, Bld: 99 mg/dL (ref 70–99)
Potassium: 4.1 mmol/L (ref 3.5–5.1)
Sodium: 143 mmol/L (ref 135–145)
Total Bilirubin: 1 mg/dL (ref 0.3–1.2)
Total Protein: 7 g/dL (ref 6.5–8.1)

## 2019-02-22 LAB — CBC
HCT: 42.2 % (ref 39.0–52.0)
Hemoglobin: 13.8 g/dL (ref 13.0–17.0)
MCH: 32.2 pg (ref 26.0–34.0)
MCHC: 32.7 g/dL (ref 30.0–36.0)
MCV: 98.4 fL (ref 80.0–100.0)
Platelets: 206 10*3/uL (ref 150–400)
RBC: 4.29 MIL/uL (ref 4.22–5.81)
RDW: 13.5 % (ref 11.5–15.5)
WBC: 4.1 10*3/uL (ref 4.0–10.5)
nRBC: 0 % (ref 0.0–0.2)

## 2019-02-22 LAB — URINALYSIS, ROUTINE W REFLEX MICROSCOPIC
Bilirubin Urine: NEGATIVE
Glucose, UA: NEGATIVE mg/dL
Hgb urine dipstick: NEGATIVE
Ketones, ur: NEGATIVE mg/dL
Leukocytes,Ua: NEGATIVE
Nitrite: NEGATIVE
Protein, ur: NEGATIVE mg/dL
Specific Gravity, Urine: 1.003 — ABNORMAL LOW (ref 1.005–1.030)
pH: 8 (ref 5.0–8.0)

## 2019-02-22 LAB — SURGICAL PCR SCREEN
MRSA, PCR: NEGATIVE
Staphylococcus aureus: POSITIVE — AB

## 2019-02-22 LAB — PROTIME-INR
INR: 0.9 (ref 0.8–1.2)
Prothrombin Time: 12.4 seconds (ref 11.4–15.2)

## 2019-02-22 LAB — APTT: aPTT: 26 seconds (ref 24–36)

## 2019-02-25 ENCOUNTER — Other Ambulatory Visit (HOSPITAL_COMMUNITY)
Admission: RE | Admit: 2019-02-25 | Discharge: 2019-02-25 | Disposition: A | Discharge status: Home / Self Care | Payer: BC Managed Care – PPO | Source: Ambulatory Visit | Attending: Orthopedic Surgery | Admitting: Orthopedic Surgery

## 2019-02-25 DIAGNOSIS — Z1159 Encounter for screening for other viral diseases: Secondary | ICD-10-CM | POA: Insufficient documentation

## 2019-02-25 LAB — SARS CORONAVIRUS 2 (TAT 6-24 HRS): SARS Coronavirus 2: NEGATIVE

## 2019-02-28 ENCOUNTER — Ambulatory Visit: Payer: Self-pay | Admitting: Orthopedic Surgery

## 2019-02-28 NOTE — H&P (Signed)
TOTAL KNEE ADMISSION H&P  Patient is being admitted for left total knee arthroplasty.  Subjective:  Chief Complaint:left knee pain.  HPI: Christopher [name], 63 y.o. male, has a history of pain and functional disability in the left knee due to arthritis and has failed non-surgical conservative treatments for greater than 12 weeks to includeNSAID's and/or analgesics, corticosteriod injections, flexibility and strengthening excercises, use of assistive devices, weight reduction as appropriate and activity modification.  Onset of symptoms was gradual, starting 5 years ago with gradually worsening course since that time. The patient noted no past surgery on the left knee(s).  Patient currently rates pain in the left knee(s) at 10 out of 10 with activity. Patient has night pain, worsening of pain with activity and weight bearing, pain that interferes with activities of daily living, pain with passive range of motion, crepitus and joint swelling.  Patient has evidence of subchondral cysts, subchondral sclerosis, periarticular osteophytes and joint space narrowing by imaging studies. There is no active infection.  Patient Active Problem List   Diagnosis Date Noted  . Alcohol abuse 06/13/2018  . Pain in right foot 12/28/2017  . Pain of left hand 12/28/2017  . Screening for prostate cancer 07/23/2017  . Hypogonadism in male 06/18/2015  . Glaucoma 10/23/2014  . Bloating 11/24/2013  . Early satiety 11/24/2013  . HTN (hypertension) 06/22/2012  . ANXIETY 12/15/2007  . INSOMNIA 12/15/2007  . ANEMIA-NOS 08/16/2007  . Depression 08/16/2007   Past Medical History:  Diagnosis Date  . Arthritis   . Colon polyps 09/26/2012   TUBULAR ADENOMA (X1)  . Depression   . Hypertension   . Insomnia     Past Surgical History:  Procedure Laterality Date  . COLONOSCOPY  09-26-12   per Dr. Henrene Pastor, adenomatous polyps, repeat in 5 yrs   . COLONOSCOPY  2019  . EYE SURGERY     for glaucoma  . REPLACEMENT TOTAL KNEE      right  . ROTATOR CUFF REPAIR     right  . ROTATOR CUFF REPAIR     left  . TOE SURGERY  08/2018   Dr Doran Durand   . WISDOM TOOTH EXTRACTION      Current Outpatient Medications  Medication Sig Dispense Refill Last Dose  . LORazepam (ATIVAN) 0.5 MG tablet TAKE ONE TABLET BY MOUTH TWICE A DAY AS NEEDED (Patient taking differently: Take 0.5 mg by mouth at bedtime as needed for sleep. ) 60 tablet 5 Taking  . losartan (COZAAR) 100 MG tablet Take 1/2 tablet by mouth daily (Patient taking differently: Take 50 mg by mouth daily. Take 1/2 tablet by mouth daily) 45 tablet 3   . Omega-3 1000 MG CAPS Take 1,000 mg by mouth daily.     . Turmeric 500 MG CAPS Take 500 mg by mouth daily.      No current facility-administered medications for this visit.    Allergies  Allergen Reactions  . Oxycodone Nausea Only    "doesn't agree with me"     Social History   Tobacco Use  . Smoking status: Never Smoker  . Smokeless tobacco: Never Used  Substance Use Topics  . Alcohol use: Yes    Alcohol/week: 10.0 standard drinks    Types: 10 Standard drinks or equivalent per week    Comment: 2-3 drinks every night    Family History  Problem Relation Age of Onset  . Hypertension Father   . Hypertension Brother   . Colon polyps Maternal Uncle  famila polposis  . Other Neg Hx        hypogonadism  . Colon cancer Neg Hx      Review of Systems  Constitutional: Negative.   HENT: Negative.   Eyes: Negative.   Respiratory: Negative.   Cardiovascular: Negative.   Gastrointestinal: Negative.   Genitourinary: Negative.   Musculoskeletal: Positive for joint pain.  Skin: Negative.   Neurological: Negative.   Endo/Heme/Allergies: Negative.   Psychiatric/Behavioral: Negative.     Objective:  Physical Exam  Vitals reviewed. Constitutional: He is oriented to person, place, and time. He appears well-developed.  HENT:  Head: Normocephalic and atraumatic.  Eyes: Pupils are equal, round, and reactive to  light. Conjunctivae and EOM are normal.  Neck: Normal range of motion. Neck supple.  Cardiovascular: Normal rate, regular rhythm and intact distal pulses.  GI: Soft. He exhibits no distension.  Genitourinary:    Genitourinary Comments: deferred   Musculoskeletal:     Left knee: He exhibits decreased range of motion, swelling, abnormal alignment and bony tenderness. Tenderness found. Medial joint line and lateral joint line tenderness noted.  Neurological: He is alert and oriented to person, place, and time. He has normal reflexes.  Skin: Skin is warm and dry.  Psychiatric: He has a normal mood and affect. His behavior is normal. Judgment and thought content normal.    Vital signs in last 24 hours: @VSRANGES @  Labs:   Estimated body mass index is 24.1 kg/m as calculated from the following:   Height as of 02/22/19: 6' (1.829 m).   Weight as of 02/22/19: 80.6 kg.   Imaging Review Plain radiographs demonstrate severe degenerative joint disease of the left knee(s). The overall alignment issignificant varus. The bone quality appears to be adequate for age and reported activity level.      Assessment/Plan:  End stage arthritis, left knee   The patient history, physical examination, clinical judgment of the provider and imaging studies are consistent with end stage degenerative joint disease of the left knee(s) and total knee arthroplasty is deemed medically necessary. The treatment options including medical management, injection therapy arthroscopy and arthroplasty were discussed at length. The risks and benefits of total knee arthroplasty were presented and reviewed. The risks due to aseptic loosening, infection, stiffness, patella tracking problems, thromboembolic complications and other imponderables were discussed. The patient acknowledged the explanation, agreed to proceed with the plan and consent was signed. Patient is being admitted for inpatient treatment for surgery, pain  control, PT, OT, prophylactic antibiotics, VTE prophylaxis, progressive ambulation and ADL's and discharge planning. The patient is planning to be discharged home with OPPT     Patient's anticipated LOS is less than 2 midnights, meeting these requirements: - Younger than 78 - Lives within 1 hour of care - Has a competent adult at home to recover with post-op recover - NO history of  - Chronic pain requiring opiods  - Diabetes  - Coronary Artery Disease  - Heart failure  - Heart attack  - Stroke  - DVT/VTE  - Cardiac arrhythmia  - Respiratory Failure/COPD  - Renal failure  - Anemia  - Advanced Liver disease

## 2019-02-28 NOTE — H&P (View-Only) (Signed)
TOTAL KNEE ADMISSION H&P  Patient is being admitted for left total knee arthroplasty.  Subjective:  Chief Complaint:left knee pain.  HPI: Christopher [name], 63 y.o. male, has a history of pain and functional disability in the left knee due to arthritis and has failed non-surgical conservative treatments for greater than 12 weeks to includeNSAID's and/or analgesics, corticosteriod injections, flexibility and strengthening excercises, use of assistive devices, weight reduction as appropriate and activity modification.  Onset of symptoms was gradual, starting 5 years ago with gradually worsening course since that time. The patient noted no past surgery on the left knee(s).  Patient currently rates pain in the left knee(s) at 10 out of 10 with activity. Patient has night pain, worsening of pain with activity and weight bearing, pain that interferes with activities of daily living, pain with passive range of motion, crepitus and joint swelling.  Patient has evidence of subchondral cysts, subchondral sclerosis, periarticular osteophytes and joint space narrowing by imaging studies. There is no active infection.  Patient Active Problem List   Diagnosis Date Noted  . Alcohol abuse 06/13/2018  . Pain in right foot 12/28/2017  . Pain of left hand 12/28/2017  . Screening for prostate cancer 07/23/2017  . Hypogonadism in male 06/18/2015  . Glaucoma 10/23/2014  . Bloating 11/24/2013  . Early satiety 11/24/2013  . HTN (hypertension) 06/22/2012  . ANXIETY 12/15/2007  . INSOMNIA 12/15/2007  . ANEMIA-NOS 08/16/2007  . Depression 08/16/2007   Past Medical History:  Diagnosis Date  . Arthritis   . Colon polyps 09/26/2012   TUBULAR ADENOMA (X1)  . Depression   . Hypertension   . Insomnia     Past Surgical History:  Procedure Laterality Date  . COLONOSCOPY  09-26-12   per Dr. Henrene Pastor, adenomatous polyps, repeat in 5 yrs   . COLONOSCOPY  2019  . EYE SURGERY     for glaucoma  . REPLACEMENT TOTAL KNEE      right  . ROTATOR CUFF REPAIR     right  . ROTATOR CUFF REPAIR     left  . TOE SURGERY  08/2018   Dr Doran Durand   . WISDOM TOOTH EXTRACTION      Current Outpatient Medications  Medication Sig Dispense Refill Last Dose  . LORazepam (ATIVAN) 0.5 MG tablet TAKE ONE TABLET BY MOUTH TWICE A DAY AS NEEDED (Patient taking differently: Take 0.5 mg by mouth at bedtime as needed for sleep. ) 60 tablet 5 Taking  . losartan (COZAAR) 100 MG tablet Take 1/2 tablet by mouth daily (Patient taking differently: Take 50 mg by mouth daily. Take 1/2 tablet by mouth daily) 45 tablet 3   . Omega-3 1000 MG CAPS Take 1,000 mg by mouth daily.     . Turmeric 500 MG CAPS Take 500 mg by mouth daily.      No current facility-administered medications for this visit.    Allergies  Allergen Reactions  . Oxycodone Nausea Only    "doesn't agree with me"     Social History   Tobacco Use  . Smoking status: Never Smoker  . Smokeless tobacco: Never Used  Substance Use Topics  . Alcohol use: Yes    Alcohol/week: 10.0 standard drinks    Types: 10 Standard drinks or equivalent per week    Comment: 2-3 drinks every night    Family History  Problem Relation Age of Onset  . Hypertension Father   . Hypertension Brother   . Colon polyps Maternal Uncle  famila polposis  . Other Neg Hx        hypogonadism  . Colon cancer Neg Hx      Review of Systems  Constitutional: Negative.   HENT: Negative.   Eyes: Negative.   Respiratory: Negative.   Cardiovascular: Negative.   Gastrointestinal: Negative.   Genitourinary: Negative.   Musculoskeletal: Positive for joint pain.  Skin: Negative.   Neurological: Negative.   Endo/Heme/Allergies: Negative.   Psychiatric/Behavioral: Negative.     Objective:  Physical Exam  Vitals reviewed. Constitutional: He is oriented to person, place, and time. He appears well-developed.  HENT:  Head: Normocephalic and atraumatic.  Eyes: Pupils are equal, round, and reactive to  light. Conjunctivae and EOM are normal.  Neck: Normal range of motion. Neck supple.  Cardiovascular: Normal rate, regular rhythm and intact distal pulses.  GI: Soft. He exhibits no distension.  Genitourinary:    Genitourinary Comments: deferred   Musculoskeletal:     Left knee: He exhibits decreased range of motion, swelling, abnormal alignment and bony tenderness. Tenderness found. Medial joint line and lateral joint line tenderness noted.  Neurological: He is alert and oriented to person, place, and time. He has normal reflexes.  Skin: Skin is warm and dry.  Psychiatric: He has a normal mood and affect. His behavior is normal. Judgment and thought content normal.    Vital signs in last 24 hours: @VSRANGES @  Labs:   Estimated body mass index is 24.1 kg/m as calculated from the following:   Height as of 02/22/19: 6' (1.829 m).   Weight as of 02/22/19: 80.6 kg.   Imaging Review Plain radiographs demonstrate severe degenerative joint disease of the left knee(s). The overall alignment issignificant varus. The bone quality appears to be adequate for age and reported activity level.      Assessment/Plan:  End stage arthritis, left knee   The patient history, physical examination, clinical judgment of the provider and imaging studies are consistent with end stage degenerative joint disease of the left knee(s) and total knee arthroplasty is deemed medically necessary. The treatment options including medical management, injection therapy arthroscopy and arthroplasty were discussed at length. The risks and benefits of total knee arthroplasty were presented and reviewed. The risks due to aseptic loosening, infection, stiffness, patella tracking problems, thromboembolic complications and other imponderables were discussed. The patient acknowledged the explanation, agreed to proceed with the plan and consent was signed. Patient is being admitted for inpatient treatment for surgery, pain  control, PT, OT, prophylactic antibiotics, VTE prophylaxis, progressive ambulation and ADL's and discharge planning. The patient is planning to be discharged home with OPPT     Patient's anticipated LOS is less than 2 midnights, meeting these requirements: - Younger than 82 - Lives within 1 hour of care - Has a competent adult at home to recover with post-op recover - NO history of  - Chronic pain requiring opiods  - Diabetes  - Coronary Artery Disease  - Heart failure  - Heart attack  - Stroke  - DVT/VTE  - Cardiac arrhythmia  - Respiratory Failure/COPD  - Renal failure  - Anemia  - Advanced Liver disease

## 2019-03-01 ENCOUNTER — Encounter (HOSPITAL_COMMUNITY): Payer: Self-pay | Admitting: Anesthesiology

## 2019-03-01 ENCOUNTER — Inpatient Hospital Stay (HOSPITAL_COMMUNITY): Payer: BC Managed Care – PPO | Admitting: Physician Assistant

## 2019-03-01 ENCOUNTER — Inpatient Hospital Stay (HOSPITAL_COMMUNITY): Payer: BC Managed Care – PPO

## 2019-03-01 ENCOUNTER — Encounter (HOSPITAL_COMMUNITY)
Admission: RE | Disposition: A | Discharge status: Home / Self Care | Payer: Self-pay | Source: Home / Self Care | Attending: Orthopedic Surgery

## 2019-03-01 ENCOUNTER — Inpatient Hospital Stay (HOSPITAL_COMMUNITY)
Admission: RE | Admit: 2019-03-01 | Discharge: 2019-03-02 | DRG: 470 | Disposition: A | Discharge status: Home / Self Care | Payer: BC Managed Care – PPO | Attending: Orthopedic Surgery | Admitting: Orthopedic Surgery

## 2019-03-01 ENCOUNTER — Inpatient Hospital Stay (HOSPITAL_COMMUNITY): Payer: BC Managed Care – PPO | Admitting: Anesthesiology

## 2019-03-01 ENCOUNTER — Other Ambulatory Visit: Payer: Self-pay

## 2019-03-01 DIAGNOSIS — Z8371 Family history of colonic polyps: Secondary | ICD-10-CM | POA: Diagnosis not present

## 2019-03-01 DIAGNOSIS — M1712 Unilateral primary osteoarthritis, left knee: Principal | ICD-10-CM | POA: Diagnosis present

## 2019-03-01 DIAGNOSIS — Z96652 Presence of left artificial knee joint: Secondary | ICD-10-CM

## 2019-03-01 DIAGNOSIS — Z86010 Personal history of colonic polyps: Secondary | ICD-10-CM | POA: Diagnosis not present

## 2019-03-01 DIAGNOSIS — H409 Unspecified glaucoma: Secondary | ICD-10-CM | POA: Diagnosis present

## 2019-03-01 DIAGNOSIS — Z8249 Family history of ischemic heart disease and other diseases of the circulatory system: Secondary | ICD-10-CM | POA: Diagnosis not present

## 2019-03-01 DIAGNOSIS — I10 Essential (primary) hypertension: Secondary | ICD-10-CM | POA: Diagnosis present

## 2019-03-01 DIAGNOSIS — Z96651 Presence of right artificial knee joint: Secondary | ICD-10-CM | POA: Diagnosis present

## 2019-03-01 DIAGNOSIS — Z885 Allergy status to narcotic agent status: Secondary | ICD-10-CM

## 2019-03-01 DIAGNOSIS — G4700 Insomnia, unspecified: Secondary | ICD-10-CM | POA: Diagnosis present

## 2019-03-01 DIAGNOSIS — Z79899 Other long term (current) drug therapy: Secondary | ICD-10-CM

## 2019-03-01 DIAGNOSIS — F329 Major depressive disorder, single episode, unspecified: Secondary | ICD-10-CM | POA: Diagnosis present

## 2019-03-01 HISTORY — PX: KNEE ARTHROPLASTY: SHX992

## 2019-03-01 LAB — TYPE AND SCREEN
ABO/RH(D): A POS
Antibody Screen: NEGATIVE

## 2019-03-01 SURGERY — ARTHROPLASTY, KNEE, TOTAL, USING IMAGELESS COMPUTER-ASSISTED NAVIGATION
Anesthesia: Spinal | Laterality: Left

## 2019-03-01 MED ORDER — LOSARTAN POTASSIUM 50 MG PO TABS: 50.0000 mg | ORAL_TABLET | Freq: Every day | ORAL | Status: DC

## 2019-03-01 MED ORDER — SODIUM CHLORIDE 0.9% FLUSH
INTRAVENOUS | Status: DC | PRN
  Administered 2019-03-01: 30 mL

## 2019-03-01 MED ORDER — METHOCARBAMOL 500 MG PO TABS
500.0000 mg | ORAL_TABLET | Freq: Four times a day (QID) | ORAL | Status: DC | PRN
  Administered 2019-03-01 – 2019-03-02 (×2): 500 mg via ORAL
  Filled 2019-03-01 (×2): qty 1

## 2019-03-01 MED ORDER — DEXAMETHASONE SODIUM PHOSPHATE 10 MG/ML IJ SOLN
INTRAMUSCULAR | Status: DC | PRN
  Administered 2019-03-01: 10 mg via INTRAVENOUS

## 2019-03-01 MED ORDER — LOSARTAN POTASSIUM 50 MG PO TABS
50.0000 mg | ORAL_TABLET | Freq: Every day | ORAL | Status: DC
  Administered 2019-03-02: 50 mg via ORAL
  Filled 2019-03-01: qty 1

## 2019-03-01 MED ORDER — MIDAZOLAM HCL 5 MG/5ML IJ SOLN
INTRAMUSCULAR | Status: DC | PRN
  Administered 2019-03-01: 2 mg via INTRAVENOUS

## 2019-03-01 MED ORDER — DEXAMETHASONE SODIUM PHOSPHATE 10 MG/ML IJ SOLN
10.0000 mg | Freq: Once | INTRAMUSCULAR | Status: AC
  Administered 2019-03-02: 10 mg via INTRAVENOUS
  Filled 2019-03-01: qty 1

## 2019-03-01 MED ORDER — DOCUSATE SODIUM 100 MG PO CAPS
100.0000 mg | ORAL_CAPSULE | Freq: Two times a day (BID) | ORAL | Status: DC
  Administered 2019-03-01 – 2019-03-02 (×2): 100 mg via ORAL
  Filled 2019-03-01 (×2): qty 1

## 2019-03-01 MED ORDER — CEFAZOLIN SODIUM-DEXTROSE 2-4 GM/100ML-% IV SOLN
2.0000 g | INTRAVENOUS | Status: AC
  Administered 2019-03-01: 11:00:00 2000 mg via INTRAVENOUS
  Filled 2019-03-01: qty 100

## 2019-03-01 MED ORDER — SODIUM CHLORIDE 0.9 % IV SOLN
INTRAVENOUS | Status: DC | PRN
  Administered 2019-03-01: 12:00:00 20 ug/min via INTRAVENOUS

## 2019-03-01 MED ORDER — POLYETHYLENE GLYCOL 3350 17 G PO PACK: 17.0000 g | PACK | Freq: Every day | ORAL | Status: DC | PRN

## 2019-03-01 MED ORDER — SENNA 8.6 MG PO TABS
1.0000 | ORAL_TABLET | Freq: Two times a day (BID) | ORAL | Status: DC
  Administered 2019-03-01 – 2019-03-02 (×2): 8.6 mg via ORAL
  Filled 2019-03-01 (×2): qty 1

## 2019-03-01 MED ORDER — SODIUM CHLORIDE 0.9 % IV SOLN
INTRAVENOUS | Status: DC
  Administered 2019-03-01: 18:00:00 via INTRAVENOUS

## 2019-03-01 MED ORDER — POVIDONE-IODINE 10 % EX SWAB: 2.0000 "application " | Freq: Once | CUTANEOUS | Status: DC

## 2019-03-01 MED ORDER — TRANEXAMIC ACID-NACL 1000-0.7 MG/100ML-% IV SOLN
1000.0000 mg | INTRAVENOUS | Status: AC
  Administered 2019-03-01: 11:00:00 1000 mg via INTRAVENOUS
  Filled 2019-03-01: qty 100

## 2019-03-01 MED ORDER — ONDANSETRON HCL 4 MG PO TABS: 4.0000 mg | ORAL_TABLET | Freq: Four times a day (QID) | ORAL | Status: DC | PRN

## 2019-03-01 MED ORDER — ISOPROPYL ALCOHOL 70 % SOLN
Status: DC | PRN
  Administered 2019-03-01: 1 via TOPICAL

## 2019-03-01 MED ORDER — PROPOFOL 10 MG/ML IV BOLUS
INTRAVENOUS | Status: AC
  Filled 2019-03-01: qty 60

## 2019-03-01 MED ORDER — ASPIRIN 81 MG PO CHEW
81.0000 mg | CHEWABLE_TABLET | Freq: Two times a day (BID) | ORAL | Status: DC
  Administered 2019-03-01 – 2019-03-02 (×2): 81 mg via ORAL
  Filled 2019-03-01 (×2): qty 1

## 2019-03-01 MED ORDER — MIDAZOLAM HCL 2 MG/2ML IJ SOLN
INTRAMUSCULAR | Status: AC
  Administered 2019-03-01: 2 mg via INTRAVENOUS
  Filled 2019-03-01: qty 2

## 2019-03-01 MED ORDER — ACETAMINOPHEN 325 MG PO TABS: 325.0000 mg | ORAL_TABLET | Freq: Four times a day (QID) | ORAL | Status: DC | PRN

## 2019-03-01 MED ORDER — CELECOXIB 200 MG PO CAPS
200.0000 mg | ORAL_CAPSULE | Freq: Two times a day (BID) | ORAL | Status: DC
  Administered 2019-03-01 – 2019-03-02 (×2): 200 mg via ORAL
  Filled 2019-03-01 (×2): qty 1

## 2019-03-01 MED ORDER — BUPIVACAINE-EPINEPHRINE 0.25% -1:200000 IJ SOLN
INTRAMUSCULAR | Status: DC | PRN
  Administered 2019-03-01: 30 mL

## 2019-03-01 MED ORDER — FENTANYL CITRATE (PF) 100 MCG/2ML IJ SOLN
INTRAMUSCULAR | Status: AC
  Filled 2019-03-01: qty 2

## 2019-03-01 MED ORDER — BUPIVACAINE-EPINEPHRINE (PF) 0.25% -1:200000 IJ SOLN
INTRAMUSCULAR | Status: AC
  Filled 2019-03-01: qty 30

## 2019-03-01 MED ORDER — EPHEDRINE 5 MG/ML INJ
INTRAVENOUS | Status: AC
  Filled 2019-03-01: qty 10

## 2019-03-01 MED ORDER — MIDAZOLAM HCL 2 MG/2ML IJ SOLN
INTRAMUSCULAR | Status: AC
  Filled 2019-03-01: qty 2

## 2019-03-01 MED ORDER — MEPERIDINE HCL 50 MG/ML IJ SOLN: 6.2500 mg | INTRAMUSCULAR | Status: DC | PRN

## 2019-03-01 MED ORDER — ONDANSETRON HCL 4 MG/2ML IJ SOLN: 4.0000 mg | Freq: Once | INTRAMUSCULAR | Status: DC | PRN

## 2019-03-01 MED ORDER — EPHEDRINE SULFATE-NACL 50-0.9 MG/10ML-% IV SOSY
PREFILLED_SYRINGE | INTRAVENOUS | Status: DC | PRN
  Administered 2019-03-01 (×4): 5 mg via INTRAVENOUS

## 2019-03-01 MED ORDER — PHENOL 1.4 % MT LIQD
1.0000 | OROMUCOSAL | Status: DC | PRN
  Filled 2019-03-01: qty 177

## 2019-03-01 MED ORDER — LACTATED RINGERS IV SOLN
INTRAVENOUS | Status: DC
  Administered 2019-03-01 (×3): via INTRAVENOUS

## 2019-03-01 MED ORDER — KETOROLAC TROMETHAMINE 30 MG/ML IJ SOLN
INTRAMUSCULAR | Status: DC | PRN
  Administered 2019-03-01: 30 mg

## 2019-03-01 MED ORDER — SODIUM CHLORIDE 0.9 % IR SOLN
Status: DC | PRN
  Administered 2019-03-01: 1000 mL

## 2019-03-01 MED ORDER — HYDROMORPHONE HCL 1 MG/ML IJ SOLN: 0.2500 mg | INTRAMUSCULAR | Status: DC | PRN

## 2019-03-01 MED ORDER — FENTANYL CITRATE (PF) 100 MCG/2ML IJ SOLN
50.0000 ug | INTRAMUSCULAR | Status: AC
  Administered 2019-03-01: 50 ug via INTRAVENOUS

## 2019-03-01 MED ORDER — ONDANSETRON HCL 4 MG/2ML IJ SOLN: 4.0000 mg | Freq: Four times a day (QID) | INTRAMUSCULAR | Status: DC | PRN

## 2019-03-01 MED ORDER — HYDROCODONE-ACETAMINOPHEN 5-325 MG PO TABS
1.0000 | ORAL_TABLET | ORAL | Status: DC | PRN
  Administered 2019-03-02: 2 via ORAL
  Filled 2019-03-01: qty 2

## 2019-03-01 MED ORDER — SODIUM CHLORIDE 0.9 % IR SOLN
Status: DC | PRN
  Administered 2019-03-01: 3000 mL

## 2019-03-01 MED ORDER — METOCLOPRAMIDE HCL 5 MG PO TABS: 5.0000 mg | ORAL_TABLET | Freq: Three times a day (TID) | ORAL | Status: DC | PRN

## 2019-03-01 MED ORDER — FENTANYL CITRATE (PF) 100 MCG/2ML IJ SOLN
INTRAMUSCULAR | Status: AC
  Administered 2019-03-01: 50 ug via INTRAVENOUS
  Filled 2019-03-01: qty 2

## 2019-03-01 MED ORDER — ROPIVACAINE HCL 7.5 MG/ML IJ SOLN
INTRAMUSCULAR | Status: DC | PRN
  Administered 2019-03-01: 20 mL via PERINEURAL

## 2019-03-01 MED ORDER — ONDANSETRON HCL 4 MG/2ML IJ SOLN
INTRAMUSCULAR | Status: DC | PRN
  Administered 2019-03-01: 4 mg via INTRAVENOUS

## 2019-03-01 MED ORDER — KETOROLAC TROMETHAMINE 30 MG/ML IJ SOLN
INTRAMUSCULAR | Status: AC
  Filled 2019-03-01: qty 1

## 2019-03-01 MED ORDER — ACETAMINOPHEN 10 MG/ML IV SOLN
1000.0000 mg | INTRAVENOUS | Status: AC
  Administered 2019-03-01: 1000 mg via INTRAVENOUS
  Filled 2019-03-01: qty 100

## 2019-03-01 MED ORDER — METHOCARBAMOL 500 MG IVPB - SIMPLE MED
500.0000 mg | Freq: Four times a day (QID) | INTRAVENOUS | Status: DC | PRN
  Filled 2019-03-01: qty 50

## 2019-03-01 MED ORDER — ALUM & MAG HYDROXIDE-SIMETH 200-200-20 MG/5ML PO SUSP: 30.0000 mL | ORAL | Status: DC | PRN

## 2019-03-01 MED ORDER — PROPOFOL 500 MG/50ML IV EMUL
INTRAVENOUS | Status: DC | PRN
  Administered 2019-03-01: 125 ug/kg/min via INTRAVENOUS

## 2019-03-01 MED ORDER — SODIUM CHLORIDE 0.9 % IV SOLN: INTRAVENOUS | Status: DC

## 2019-03-01 MED ORDER — PHENYLEPHRINE HCL (PRESSORS) 10 MG/ML IV SOLN
INTRAVENOUS | Status: AC
  Filled 2019-03-01: qty 1

## 2019-03-01 MED ORDER — PROPOFOL 10 MG/ML IV BOLUS
INTRAVENOUS | Status: DC | PRN
  Administered 2019-03-01: 100 mg via INTRAVENOUS
  Administered 2019-03-01: 50 mg via INTRAVENOUS

## 2019-03-01 MED ORDER — DIPHENHYDRAMINE HCL 12.5 MG/5ML PO ELIX: 12.5000 mg | ORAL_SOLUTION | ORAL | Status: DC | PRN

## 2019-03-01 MED ORDER — MORPHINE SULFATE (PF) 2 MG/ML IV SOLN: 0.5000 mg | INTRAVENOUS | Status: DC | PRN

## 2019-03-01 MED ORDER — FENTANYL CITRATE (PF) 100 MCG/2ML IJ SOLN
INTRAMUSCULAR | Status: DC | PRN
  Administered 2019-03-01 (×2): 50 ug via INTRAVENOUS

## 2019-03-01 MED ORDER — STERILE WATER FOR IRRIGATION IR SOLN
Status: DC | PRN
  Administered 2019-03-01: 2000 mL

## 2019-03-01 MED ORDER — CHLORHEXIDINE GLUCONATE 4 % EX LIQD: 60.0000 mL | Freq: Once | CUTANEOUS | Status: DC

## 2019-03-01 MED ORDER — CEFAZOLIN SODIUM-DEXTROSE 2-4 GM/100ML-% IV SOLN
2.0000 g | Freq: Four times a day (QID) | INTRAVENOUS | Status: AC
  Administered 2019-03-01 (×2): 2 g via INTRAVENOUS
  Filled 2019-03-01 (×2): qty 100

## 2019-03-01 MED ORDER — MENTHOL 3 MG MT LOZG: 1.0000 | LOZENGE | OROMUCOSAL | Status: DC | PRN

## 2019-03-01 MED ORDER — METOCLOPRAMIDE HCL 5 MG/ML IJ SOLN: 5.0000 mg | Freq: Three times a day (TID) | INTRAMUSCULAR | Status: DC | PRN

## 2019-03-01 MED ORDER — PROPOFOL 10 MG/ML IV BOLUS
INTRAVENOUS | Status: AC
  Filled 2019-03-01: qty 20

## 2019-03-01 MED ORDER — HYDROCODONE-ACETAMINOPHEN 7.5-325 MG PO TABS
1.0000 | ORAL_TABLET | ORAL | Status: DC | PRN
  Administered 2019-03-01: 1 via ORAL
  Administered 2019-03-02 (×3): 2 via ORAL
  Filled 2019-03-01: qty 2
  Filled 2019-03-01: qty 1
  Filled 2019-03-01 (×2): qty 2

## 2019-03-01 MED ORDER — MIDAZOLAM HCL 2 MG/2ML IJ SOLN
1.0000 mg | INTRAMUSCULAR | Status: AC
  Administered 2019-03-01: 09:00:00 2 mg via INTRAVENOUS

## 2019-03-01 MED ORDER — SODIUM CHLORIDE (PF) 0.9 % IJ SOLN
INTRAMUSCULAR | Status: AC
  Filled 2019-03-01: qty 50

## 2019-03-01 SURGICAL SUPPLY — 69 items
BAG ZIPLOCK 12X15 (MISCELLANEOUS) IMPLANT
BANDAGE ACE 4X5 VEL STRL LF (GAUZE/BANDAGES/DRESSINGS) ×4 IMPLANT
BANDAGE ACE 6X5 VEL STRL LF (GAUZE/BANDAGES/DRESSINGS) ×4 IMPLANT
BATTERY INSTRU NAVIGATION (MISCELLANEOUS) ×6 IMPLANT
BLADE SAW RECIPROCATING 77.5 (BLADE) ×2 IMPLANT
BNDG CONFORM 6X.82 1P STRL (GAUZE/BANDAGES/DRESSINGS) ×2 IMPLANT
CHLORAPREP W/TINT 26 (MISCELLANEOUS) ×4 IMPLANT
COMP FEM SZ5 CRUC LEFT RETAIN (Orthopedic Implant) ×2 IMPLANT
COMPONENT FEM SZ5 CRU LT RETN (Orthopedic Implant) ×1 IMPLANT
COVER SURGICAL LIGHT HANDLE (MISCELLANEOUS) ×2 IMPLANT
COVER WAND RF STERILE (DRAPES) ×2 IMPLANT
CUFF TOURN SGL QUICK 34 (TOURNIQUET CUFF) ×1
CUFF TRNQT CYL 34X4.125X (TOURNIQUET CUFF) ×1 IMPLANT
DECANTER SPIKE VIAL GLASS SM (MISCELLANEOUS) ×4 IMPLANT
DERMABOND ADVANCED (GAUZE/BANDAGES/DRESSINGS) ×1
DERMABOND ADVANCED .7 DNX12 (GAUZE/BANDAGES/DRESSINGS) ×1 IMPLANT
DRAPE SHEET LG 3/4 BI-LAMINATE (DRAPES) ×6 IMPLANT
DRAPE U-SHAPE 47X51 STRL (DRAPES) ×2 IMPLANT
DRSG AQUACEL AG ADV 3.5X10 (GAUZE/BANDAGES/DRESSINGS) ×2 IMPLANT
DRSG TEGADERM 4X4.75 (GAUZE/BANDAGES/DRESSINGS) IMPLANT
ELECT BLADE TIP CTD 4 INCH (ELECTRODE) ×2 IMPLANT
ELECT REM PT RETURN 15FT ADLT (MISCELLANEOUS) ×2 IMPLANT
EVACUATOR 1/8 PVC DRAIN (DRAIN) IMPLANT
GAUZE SPONGE 4X4 12PLY STRL (GAUZE/BANDAGES/DRESSINGS) ×2 IMPLANT
GLOVE BIO SURGEON STRL SZ8.5 (GLOVE) ×4 IMPLANT
GLOVE BIOGEL PI IND STRL 8.5 (GLOVE) ×1 IMPLANT
GLOVE BIOGEL PI INDICATOR 8.5 (GLOVE) ×1
GOWN SPEC L3 XXLG W/TWL (GOWN DISPOSABLE) ×2 IMPLANT
HANDPIECE INTERPULSE COAX TIP (DISPOSABLE) ×1
HOLDER FOLEY CATH W/STRAP (MISCELLANEOUS) ×2 IMPLANT
HOOD PEEL AWAY FLYTE STAYCOOL (MISCELLANEOUS) ×6 IMPLANT
INSERT TIB TRIATH-E SZ5 11X3 (Insert) ×2 IMPLANT
JET LAVAGE IRRISEPT WOUND (IRRIGATION / IRRIGATOR) ×2
KIT TURNOVER KIT A (KITS) IMPLANT
KNEE TIBIAL COMPONENT SZ5 (Knees) ×2 IMPLANT
LAVAGE JET IRRISEPT WOUND (IRRIGATION / IRRIGATOR) ×1 IMPLANT
MARKER SKIN DUAL TIP RULER LAB (MISCELLANEOUS) ×2 IMPLANT
NDL SAFETY ECLIPSE 18X1.5 (NEEDLE) ×1 IMPLANT
NEEDLE HYPO 18GX1.5 SHARP (NEEDLE) ×1
NEEDLE SPNL 18GX3.5 QUINCKE PK (NEEDLE) ×2 IMPLANT
NS IRRIG 1000ML POUR BTL (IV SOLUTION) ×2 IMPLANT
PACK TOTAL KNEE CUSTOM (KITS) ×2 IMPLANT
PADDING CAST COTTON 6X4 STRL (CAST SUPPLIES) ×2 IMPLANT
PATELLA ASYMMETRIC 38X11 KNEE (Orthopedic Implant) ×2 IMPLANT
PIN FLUTED HEDLESS FIX 3.5X1/8 (Miscellaneous) ×4 IMPLANT
PROTECTOR NERVE ULNAR (MISCELLANEOUS) ×2 IMPLANT
SAW OSC TIP CART 19.5X105X1.3 (SAW) ×2 IMPLANT
SEALER BIPOLAR AQUA 6.0 (INSTRUMENTS) ×2 IMPLANT
SET HNDPC FAN SPRY TIP SCT (DISPOSABLE) ×1 IMPLANT
SET PAD KNEE POSITIONER (MISCELLANEOUS) ×2 IMPLANT
SPONGE DRAIN TRACH 4X4 STRL 2S (GAUZE/BANDAGES/DRESSINGS) IMPLANT
SPONGE LAP 18X18 RF (DISPOSABLE) IMPLANT
SUT MNCRL AB 3-0 PS2 18 (SUTURE) ×2 IMPLANT
SUT MNCRL AB 4-0 PS2 18 (SUTURE) ×2 IMPLANT
SUT MON AB 2-0 CT1 36 (SUTURE) ×4 IMPLANT
SUT STRATAFIX PDO 1 14 VIOLET (SUTURE) ×1
SUT STRATFX PDO 1 14 VIOLET (SUTURE) ×1
SUT VIC AB 1 CTX 36 (SUTURE) ×2
SUT VIC AB 1 CTX36XBRD ANBCTR (SUTURE) ×2 IMPLANT
SUT VIC AB 2-0 CT1 27 (SUTURE) ×1
SUT VIC AB 2-0 CT1 TAPERPNT 27 (SUTURE) ×1 IMPLANT
SUTURE STRATFX PDO 1 14 VIOLET (SUTURE) ×1 IMPLANT
SYR 3ML LL SCALE MARK (SYRINGE) ×2 IMPLANT
SYR 50ML LL SCALE MARK (SYRINGE) ×2 IMPLANT
TOWER CARTRIDGE SMART MIX (DISPOSABLE) IMPLANT
TRAY FOLEY MTR SLVR 16FR STAT (SET/KITS/TRAYS/PACK) IMPLANT
WATER STERILE IRR 1000ML POUR (IV SOLUTION) ×4 IMPLANT
WRAP KNEE MAXI GEL POST OP (GAUZE/BANDAGES/DRESSINGS) ×2 IMPLANT
YANKAUER SUCT BULB TIP 10FT TU (MISCELLANEOUS) ×2 IMPLANT

## 2019-03-01 NOTE — Transfer of Care (Signed)
Immediate Anesthesia Transfer of Care Note  Patient: Christopher [name]  Procedure(s) Performed: Procedure(s): COMPUTER ASSISTED TOTAL KNEE ARTHROPLASTY (Left)  Patient Location: PACU  Anesthesia Type:Spinal  Level of Consciousness:  sedated, patient cooperative and responds to stimulation  Airway & Oxygen Therapy:Patient Spontanous Breathing and Patient connected to face mask oxgen  Post-op Assessment:  Report given to PACU RN and Post -op Vital signs reviewed and stable  Post vital signs:  Reviewed and stable  Last Vitals:  Vitals:   03/01/19 0951 03/01/19 0952  BP:    Pulse: (!) 56 60  Resp: 14 13  Temp:    SpO2: 098% 286%    Complications: No apparent anesthesia complications

## 2019-03-01 NOTE — Anesthesia Procedure Notes (Signed)
Anesthesia Regional Block: Adductor canal block   Pre-Anesthetic Checklist: ,, timeout performed, Correct Patient, Correct Site, Correct Laterality, Correct Procedure, Correct Position, site marked, Risks and benefits discussed,  Surgical consent,  Pre-op evaluation,  At surgeon's request and post-op pain management  Laterality: Left  Prep: chloraprep       Needles:  Injection technique: Single-shot  Needle Type: Echogenic Stimulator Needle     Needle Length: 9cm  Needle Gauge: 21     Additional Needles:   Narrative:  Start time: 03/01/2019 9:14 AM End time: 03/01/2019 9:24 AM Injection made incrementally with aspirations every 5 mL.  Performed by: Personally  Anesthesiologist: Lillia Abed, MD  Additional Notes: Monitors applied. Patient sedated. Sterile prep and drape,hand hygiene and sterile gloves were used. Relevant anatomy identified.Needle position confirmed.Local anesthetic injected incrementally after negative aspiration. Local anesthetic spread visualized around nerve(s). Vascular puncture avoided. No complications. Image printed for medical record.The patient tolerated the procedure well.    Lillia Abed MD

## 2019-03-01 NOTE — Anesthesia Preprocedure Evaluation (Signed)
Anesthesia Evaluation  Patient identified by MRN, date of birth, ID band Patient awake    Reviewed: Allergy & Precautions, NPO status , Patient's Chart, lab work & pertinent test results  Airway Mallampati: I  TM Distance: >3 FB Neck ROM: Full    Dental   Pulmonary    Pulmonary exam normal        Cardiovascular hypertension, Pt. on medications Normal cardiovascular exam     Neuro/Psych Anxiety Depression    GI/Hepatic   Endo/Other    Renal/GU      Musculoskeletal   Abdominal   Peds  Hematology   Anesthesia Other Findings   Reproductive/Obstetrics                             Anesthesia Physical Anesthesia Plan  ASA: II  Anesthesia Plan: Spinal   Post-op Pain Management:  Regional for Post-op pain   Induction: Intravenous  PONV Risk Score and Plan: 1 and Ondansetron  Airway Management Planned: Simple Face Mask  Additional Equipment:   Intra-op Plan:   Post-operative Plan:   Informed Consent: I have reviewed the patients History and Physical, chart, labs and discussed the procedure including the risks, benefits and alternatives for the proposed anesthesia with the patient or authorized representative who has indicated his/her understanding and acceptance.       Plan Discussed with: CRNA and Surgeon  Anesthesia Plan Comments:         Anesthesia Quick Evaluation

## 2019-03-01 NOTE — Progress Notes (Signed)
AssistedDr. Ossey with left, ultrasound guided, adductor canal block. Side rails up, monitors on throughout procedure. See vital signs in flow sheet. Tolerated Procedure well.  

## 2019-03-01 NOTE — Op Note (Signed)
OPERATIVE REPORT  SURGEON: Rod Can, MD   ASSISTANT: Staff.  PREOPERATIVE DIAGNOSIS: Left knee arthritis.   POSTOPERATIVE DIAGNOSIS: Left knee arthritis.   PROCEDURE: Left total knee arthroplasty.   IMPLANTS: Stryker Triathlon CR femur, size 5. Stryker Tritanium tibia, size 5. X3 polyethelyene insert, size 11 mm, CR. 3 button asymmetric patella, size 38 mm.  ANESTHESIA:  Regional and Spinal  TOURNIQUET TIME: Not utilized.   ESTIMATED BLOOD LOSS:-400 mL    ANTIBIOTICS: 2 g Ancef.  DRAINS: None.  COMPLICATIONS: None   CONDITION: PACU - hemodynamically stable.   BRIEF CLINICAL NOTE: Christopher [name] is a 63 y.o. male with a long-standing history of Left knee arthritis. After failing conservative management, the patient was indicated for total knee arthroplasty. The risks, benefits, and alternatives to the procedure were explained, and the patient elected to proceed.  PROCEDURE IN DETAIL: Adductor canal block was obtained in the pre-op holding area. Once inside the operative room, spinal anesthesia was obtained, and a foley catheter was inserted. The patient was then positioned, a nonsterile tourniquet was placed, and the lower extremity was prepped and draped in the normal sterile surgical fashion.  A time-out was called verifying side and site of surgery. The patient received IV antibiotics within 60 minutes of beginning the procedure. The tourniquet was not utilized.   An anterior approach to the knee was performed utilizing a midvastus arthrotomy. A medial release was performed and the patellar fat pad was excised. Stryker navigation was used to cut the distal femur perpendicular to the mechanical axis. A freehand patellar resection was performed, and the patella was sized an prepared with 3 lug holes.  Nagivation was used to make a neutral proximal tibia resection, taking 9 mm  of bone from the less affected lateral side with 3 degrees of slope. The menisci were excised. A spacer block was placed, and the alignment and balance in extension were confirmed.   The distal femur was sized using the 3-degree external rotation guide referencing the posterior femoral cortex. The appropriate 4-in-1 cutting block was pinned into place. Rotation was checked using Whiteside's line, the epicondylar axis, and then confirmed with a spacer block in flexion. The remaining femoral cuts were performed, taking care to protect the MCL.  The tibia was sized and the trial tray was pinned into place. The remaining trail components were inserted. The knee was stable to varus and valgus stress through a full range of motion. The patella tracked centrally, and the PCL was well balanced. The trial components were removed, and the proximal tibial surface was prepared. Final components were impacted into place. The knee was tested for a final time and found to be well balanced.   The wound was copiously irrigated with normal saline using pulse lavage.  Marcaine solution was injected into the periarticular soft tissue.  The wound was closed in layers using #1 Vicryl and Stratafix for the fascia, 2-0 Vicryl for the subcutaneous fat, 2-0 Monocryl for the deep dermal layer, 3-0 running Monocryl subcuticular Stitch, and 4-0 Monocryl stay sutures at both ends of the wound. Dermabond was applied to the skin.  Once the glue was fully dried, an Aquacell Ag and compressive dressing were applied.  Tthe patient was transported to the recovery room in stable condition.  Sponge, needle, and instrument counts were correct at the end of the case x2.  The patient tolerated the procedure well and there were no known complications.

## 2019-03-01 NOTE — Discharge Instructions (Signed)
° °Dr. Eriko Economos °Total Joint Specialist °[facility] °[address] °[place], [place] [address] °[phone] ° °TOTAL KNEE REPLACEMENT POSTOPERATIVE DIRECTIONS ° ° ° °Knee Rehabilitation, Guidelines Following Surgery  °Results after knee surgery are often greatly improved when you follow the exercise, range of motion and muscle strengthening exercises prescribed by your doctor. Safety measures are also important to protect the knee from further injury. Any time any of these exercises cause you to have increased pain or swelling in your knee joint, decrease the amount until you are comfortable again and slowly increase them. If you have problems or questions, call your caregiver or physical therapist for advice.  ° °WEIGHT BEARING °Weight bearing as tolerated with assist device (walker, cane, etc) as directed, use it as long as suggested by your surgeon or therapist, typically at least 4-6 weeks. ° °HOME CARE INSTRUCTIONS  °Remove items at home which could result in a fall. This includes throw rugs or furniture in walking pathways.  °Continue medications as instructed at time of discharge. °You may have some home medications which will be placed on hold until you complete the course of blood thinner medication.  °You may start showering once you are discharged home but do not submerge the incision under water. Just pat the incision dry and apply a dry gauze dressing on daily. °Walk with walker as instructed.  °You may resume a sexual relationship in one month or when given the OK by your doctor.  °· Use walker as long as suggested by your caregivers. °· Avoid periods of inactivity such as sitting longer than an hour when not asleep. This helps prevent blood clots.  °You may put full weight on your legs and walk as much as is comfortable.  °You may return to work once you are cleared by your doctor.  °Do not drive a car for 6 weeks or until released by you surgeon.  °· Do not drive  while taking narcotics.  °Wear the elastic stockings for three weeks following surgery during the day but you may remove then at night. °Make sure you keep all of your appointments after your operation with all of your doctors and caregivers. You should call the office at the above phone number and make an appointment for approximately two weeks after the date of your surgery. °Do not remove your surgical dressing. The dressing is waterproof; you may take showers in 3 days, but do not take tub baths or submerge the dressing. °Please pick up a stool softener and laxative for home use as long as you are requiring pain medications. °· ICE to the affected knee every three hours for 30 minutes at a time and then as needed for pain and swelling.  Continue to use ice on the knee for pain and swelling from surgery. You may notice swelling that will progress down to the foot and ankle.  This is normal after surgery.  Elevate the leg when you are not up walking on it.   °It is important for you to complete the blood thinner medication as prescribed by your doctor. °· Continue to use the breathing machine which will help keep your temperature down.  It is common for your temperature to cycle up and down following surgery, especially at night when you are not up moving around and exerting yourself.  The breathing machine keeps your lungs expanded and your temperature down. ° °RANGE OF MOTION AND STRENGTHENING EXERCISES  °Rehabilitation of the knee is important following   a knee injury or an operation. After just a few days of immobilization, the muscles of the thigh which control the knee become weakened and shrink (atrophy). Knee exercises are designed to build up the tone and strength of the thigh muscles and to improve knee motion. Often times heat used for twenty to thirty minutes before working out will loosen up your tissues and help with improving the range of motion but do not use heat for the first two weeks following  surgery. These exercises can be done on a training (exercise) mat, on the floor, on a table or on a bed. Use what ever works the best and is most comfortable for you Knee exercises include:  °Leg Lifts - While your knee is still immobilized in a splint or cast, you can do straight leg raises. Lift the leg to 60 degrees, hold for 3 sec, and slowly lower the leg. Repeat 10-20 times 2-3 times daily. Perform this exercise against resistance later as your knee gets better.  °Quad and Hamstring Sets - Tighten up the muscle on the front of the thigh (Quad) and hold for 5-10 sec. Repeat this 10-20 times hourly. Hamstring sets are done by pushing the foot backward against an object and holding for 5-10 sec. Repeat as with quad sets.  °A rehabilitation program following serious knee injuries can speed recovery and prevent re-injury in the future due to weakened muscles. Contact your doctor or a physical therapist for more information on knee rehabilitation.  ° °SKILLED REHAB INSTRUCTIONS: °If the patient is transferred to a skilled rehab facility following release from the hospital, a list of the current medications will be sent to the facility for the patient to continue.  When discharged from the skilled rehab facility, please have the facility set up the patient's Home Health Physical Therapy prior to being released. Also, the skilled facility will be responsible for providing the patient with their medications at time of release from the facility to include their pain medication, the muscle relaxants, and their blood thinner medication. If the patient is still at the rehab facility at time of the two week follow up appointment, the skilled rehab facility will also need to assist the patient in arranging follow up appointment in our office and any transportation needs. ° °MAKE SURE YOU:  °Understand these instructions.  °Will watch your condition.  °Will get help right away if you are not doing well or get worse.   ° ° °Pick up stool softner and laxative for home use following surgery while on pain medications. °Do NOT remove your dressing. You may shower.  °Do not take tub baths or submerge incision under water. °May shower starting three days after surgery. °Please use a clean towel to pat the incision dry following showers. °Continue to use ice for pain and swelling after surgery. °Do not use any lotions or creams on the incision until instructed by your surgeon. ° °

## 2019-03-01 NOTE — Evaluation (Signed)
Physical Therapy Evaluation Patient Details Name: Christopher [name] MRN: [id number] DOB: August 01, 1956 Today's Date: 03/01/2019   History of Present Illness  63 yo male s/p L TKR on 03/01/19. PMH includes alcohol abuse, HTN, anxiety, depression, R TKR, L and R RTC repair.  Clinical Impression  Pt presents with L knee pain, decreased L knee ROM, increased time and effort to perform mobility tasks, and decreased activity tolerance. Pt to benefit from acute PT to address deficits. Pt ambulated hallway distance with RW with min guard assist, very motivated to progress mobility. Pt educated on ankle pumps (20/hour) to perform this afternoon/evening to increase circulation, to pt's tolerance and limited by pain. PT to progress mobility as tolerated, and will continue to follow acutely.        Follow Up Recommendations Follow surgeon's recommendation for DC plan and follow-up therapies;Supervision for mobility/OOB(OPPT, starting next monday)    Equipment Recommendations  None recommended by PT    Recommendations for Other Services       Precautions / Restrictions Precautions Precautions: Fall Restrictions Weight Bearing Restrictions: No Other Position/Activity Restrictions: WBAT      Mobility  Bed Mobility Overal bed mobility: Needs Assistance Bed Mobility: Supine to Sit     Supine to sit: Min guard;HOB elevated     General bed mobility comments: min guard for safety, VC for sequencing  Transfers Overall transfer level: Needs assistance Equipment used: Rolling walker (2 wheeled) Transfers: Sit to/from Stand Sit to Stand: Min guard;From elevated surface         General transfer comment: Min guard for safety, verbal cuing for hand placement when rising.  Ambulation/Gait Ambulation/Gait assistance: Min guard Gait Distance (Feet): 80 Feet Assistive device: Rolling walker (2 wheeled) Gait Pattern/deviations: Step-to pattern;Step-through pattern;Decreased stride length Gait  velocity: decr   General Gait Details: Min guard for safety, no evidence of L knee weakness/instability. Verbal cuing for placement in RW, sequencing although pt quickly progressing to step-through gait.  Stairs            Wheelchair Mobility    Modified Rankin (Stroke Patients Only)       Balance Overall balance assessment: Mild deficits observed, not formally tested                                           Pertinent Vitals/Pain Pain Assessment: 0-10 Pain Score: 2  Pain Location: L knee Pain Descriptors / Indicators: Sore Pain Intervention(s): Limited activity within patient's tolerance;Monitored during session;Premedicated before session;Repositioned    Home Living Family/patient expects to be discharged to:: Private residence Living Arrangements: Spouse/significant other Available Help at Discharge: Family Type of Home: House Home Access: Stairs to enter   Technical brewer of Steps: 1 Home Layout: Two level;Bed/bath upstairs Home Equipment: Environmental consultant - 2 wheels;Crutches;Cane - single point      Prior Function Level of Independence: Independent         Comments: Pt is an anatomy and physiology professor at [employer] Hand: Right    Extremity/Trunk Assessment   Upper Extremity Assessment Upper Extremity Assessment: Overall WFL for tasks assessed    Lower Extremity Assessment Lower Extremity Assessment: Overall WFL for tasks assessed;LLE deficits/detail LLE Deficits / Details: able to perform ankle pumps, heel slide to 95*, SLR without quad lag or lift assist, quad set LLE Sensation: decreased light touch(decreased sensation of  gluteal region; complete control of hip flexion/extension in standing, very stable and appropriate to ambulate)    Cervical / Trunk Assessment Cervical / Trunk Assessment: Normal  Communication   Communication: No difficulties  Cognition Arousal/Alertness: Awake/alert Behavior  During Therapy: WFL for tasks assessed/performed Overall Cognitive Status: Within Functional Limits for tasks assessed                                 General Comments: Pt very pleasant and motivated      General Comments      Exercises     Assessment/Plan    PT Assessment Patient needs continued PT services  PT Problem List Decreased strength;Decreased mobility;Decreased range of motion;Decreased activity tolerance;Decreased balance;Decreased knowledge of use of DME;Pain       PT Treatment Interventions Therapeutic activities;DME instruction;Gait training;Therapeutic exercise;Patient/family education;Balance training;Stair training;Functional mobility training    PT Goals (Current goals can be found in the Care Plan section)  Acute Rehab PT Goals Patient Stated Goal: go home to family PT Goal Formulation: With patient Time For Goal Achievement: 03/08/19 Potential to Achieve Goals: Good    Frequency 7X/week   Barriers to discharge        Co-evaluation               AM-PAC PT "6 Clicks" Mobility  Outcome Measure Help needed turning from your back to your side while in a flat bed without using bedrails?: A Little Help needed moving from lying on your back to sitting on the side of a flat bed without using bedrails?: A Little Help needed moving to and from a bed to a chair (including a wheelchair)?: A Little Help needed standing up from a chair using your arms (e.g., wheelchair or bedside chair)?: A Little Help needed to walk in hospital room?: A Little Help needed climbing 3-5 steps with a railing? : A Little 6 Click Score: 18    End of Session Equipment Utilized During Treatment: Gait belt Activity Tolerance: Patient tolerated treatment well Patient left: in chair;with call bell/phone within reach;with chair alarm set(on SCD break for skin integrity) Nurse Communication: Mobility status PT Visit Diagnosis: Other abnormalities of gait and mobility  (R26.89);Difficulty in walking, not elsewhere classified (R26.2)    Time: 1610-9604 PT Time Calculation (min) (ACUTE ONLY): 18 min   Charges:   PT Evaluation $PT Eval Low Complexity: 1 Low          Julien Girt, PT Acute Rehabilitation Services Pager [phone]  Office [phone]  Roxine Caddy D Elonda Husky 03/01/2019, 7:24 PM

## 2019-03-01 NOTE — Interval H&P Note (Signed)
History and Physical Interval Note:  03/01/2019 9:48 AM  Christopher [name]  has presented today for surgery, with the diagnosis of Degenerative joint disease left knee.  The various methods of treatment have been discussed with the patient and family. After consideration of risks, benefits and other options for treatment, the patient has consented to  Procedure(s): COMPUTER ASSISTED TOTAL KNEE ARTHROPLASTY (Left) as a surgical intervention.  The patient's history has been reviewed, patient examined, no change in status, stable for surgery.  I have reviewed the patient's chart and labs.  Questions were answered to the patient's satisfaction.    The risks, benefits, and alternatives were discussed with the patient. There are risks associated with the surgery including, but not limited to, problems with anesthesia (death), infection, instability (giving out of the joint), dislocation, differences in leg length/angulation/rotation, fracture of bones, loosening or failure of implants, hematoma (blood accumulation) which may require surgical drainage, blood clots, pulmonary embolism, nerve injury (foot drop and lateral thigh numbness), and blood vessel injury. The patient understands these risks and elects to proceed.    Christopher [name]

## 2019-03-01 NOTE — Plan of Care (Signed)
progressing 

## 2019-03-01 NOTE — Anesthesia Postprocedure Evaluation (Signed)
Anesthesia Post Note  Patient: Christopher [name]  Procedure(s) Performed: COMPUTER ASSISTED TOTAL KNEE ARTHROPLASTY (Left )     Patient location during evaluation: PACU Anesthesia Type: Spinal Level of consciousness: oriented and awake and alert Pain management: pain level controlled Vital Signs Assessment: post-procedure vital signs reviewed and stable Respiratory status: spontaneous breathing, respiratory function stable and patient connected to nasal cannula oxygen Cardiovascular status: blood pressure returned to baseline and stable Postop Assessment: no headache, no backache and no apparent nausea or vomiting Anesthetic complications: no    Last Vitals:  Vitals:   03/01/19 1630 03/01/19 1659  BP: 120/90 130/78  Pulse: 63 61  Resp: 18 18  Temp: 36.6 C (!) 36.4 C  SpO2: 100% 100%    Last Pain:  Vitals:   03/01/19 1659  TempSrc: Oral  PainSc:                  Lizann Edelman DAVID

## 2019-03-01 NOTE — Anesthesia Procedure Notes (Signed)
Spinal  Start time: 03/01/2019 11:10 AM End time: 03/01/2019 11:15 AM Staffing Anesthesiologist: Lillia Abed, MD Performed: anesthesiologist  Preanesthetic Checklist Completed: patient identified, surgical consent, pre-op evaluation, timeout performed, IV checked, risks and benefits discussed and monitors and equipment checked Spinal Block Patient position: sitting Prep: ChloraPrep Patient monitoring: heart rate, cardiac monitor, continuous pulse ox and blood pressure Approach: right paramedian Location: L3-4 Injection technique: single-shot Needle Needle type: Pencan  Needle gauge: 24 G Needle length: 9 cm Needle insertion depth: 8 cm

## 2019-03-02 ENCOUNTER — Encounter (HOSPITAL_COMMUNITY): Payer: Self-pay | Admitting: Orthopedic Surgery

## 2019-03-02 LAB — BASIC METABOLIC PANEL
Anion gap: 5 (ref 5–15)
BUN: 20 mg/dL (ref 8–23)
CO2: 27 mmol/L (ref 22–32)
Calcium: 8.7 mg/dL — ABNORMAL LOW (ref 8.9–10.3)
Chloride: 106 mmol/L (ref 98–111)
Creatinine, Ser: 0.82 mg/dL (ref 0.61–1.24)
GFR calc Af Amer: >60 mL/min (ref >60)
GFR calc non Af Amer: >60 mL/min (ref >60)
Glucose, Bld: 124 mg/dL — ABNORMAL HIGH (ref 70–99)
Potassium: 4.2 mmol/L (ref 3.5–5.1)
Sodium: 138 mmol/L (ref 135–145)

## 2019-03-02 LAB — CBC
HCT: 35.2 % — ABNORMAL LOW (ref 39.0–52.0)
Hemoglobin: 11.1 g/dL — ABNORMAL LOW (ref 13.0–17.0)
MCH: 31.2 pg (ref 26.0–34.0)
MCHC: 31.5 g/dL (ref 30.0–36.0)
MCV: 98.9 fL (ref 80.0–100.0)
Platelets: 216 10*3/uL (ref 150–400)
RBC: 3.56 MIL/uL — ABNORMAL LOW (ref 4.22–5.81)
RDW: 13.2 % (ref 11.5–15.5)
WBC: 12 10*3/uL — ABNORMAL HIGH (ref 4.0–10.5)
nRBC: 0 % (ref 0.0–0.2)

## 2019-03-02 MED ORDER — HYDROCODONE-ACETAMINOPHEN 5-325 MG PO TABS: 1.0000 | ORAL_TABLET | ORAL | 0 refills | Status: DC | PRN

## 2019-03-02 MED ORDER — ONDANSETRON HCL 4 MG PO TABS: 4.0000 mg | ORAL_TABLET | Freq: Four times a day (QID) | ORAL | 0 refills | Status: DC | PRN

## 2019-03-02 MED ORDER — SENNA 8.6 MG PO TABS: 1.0000 | ORAL_TABLET | Freq: Two times a day (BID) | ORAL | 0 refills | Status: DC

## 2019-03-02 MED ORDER — ASPIRIN 81 MG PO CHEW: 81.0000 mg | CHEWABLE_TABLET | Freq: Two times a day (BID) | ORAL | 1 refills | Status: DC

## 2019-03-02 MED ORDER — DOCUSATE SODIUM 100 MG PO CAPS: 100.0000 mg | ORAL_CAPSULE | Freq: Two times a day (BID) | ORAL | 1 refills | Status: DC

## 2019-03-02 NOTE — Discharge Summary (Signed)
Physician Discharge Summary  Patient ID: Christopher [name] MRN: [id number] DOB/AGE: December 28, 1955 63 y.o.  Admit date: 03/01/2019 Discharge date: 03/02/2019  Admission Diagnoses:  Osteoarthritis of left knee  Discharge Diagnoses:  Principal Problem:   Osteoarthritis of left knee   Past Medical History:  Diagnosis Date  . Arthritis   . Colon polyps 09/26/2012   TUBULAR ADENOMA (X1)  . Depression   . Hypertension   . Insomnia     Surgeries: Procedure(s): COMPUTER ASSISTED TOTAL KNEE ARTHROPLASTY on 03/01/2019   Consultants (if any):   Discharged Condition: Improved  Hospital Course: Christopher [name] is an 63 y.o. male who was admitted 03/01/2019 with a diagnosis of Osteoarthritis of left knee and went to the operating room on 03/01/2019 and underwent the above named procedures.    He was given perioperative antibiotics:  Anti-infectives (From admission, onward)   Start     Dose/Rate Route Frequency Ordered Stop   03/01/19 1700  ceFAZolin (ANCEF) IVPB 2g/100 mL premix     2 g 200 mL/hr over 30 Minutes Intravenous Every 6 hours 03/01/19 1648 03/01/19 2302   03/01/19 0830  ceFAZolin (ANCEF) IVPB 2g/100 mL premix     2 g 200 mL/hr over 30 Minutes Intravenous On call to O.R. 03/01/19 0825 03/01/19 1118    .  He was given sequential compression devices, early ambulation, and ASA for DVT prophylaxis.  He benefited maximally from the hospital stay and there were no complications.    Recent vital signs:  Vitals:   03/02/19 0501 03/02/19 1021  BP: 134/81 140/85  Pulse: (!) 51 (!) 55  Resp: 16 16  Temp: 98 F (36.7 C) 98 F (36.7 C)  SpO2: 100% 100%    Recent laboratory studies:  Lab Results  Component Value Date   HGB 11.1 (L) 03/02/2019   HGB 13.8 02/22/2019   HGB 14.3 06/14/2018   Lab Results  Component Value Date   WBC 12.0 (H) 03/02/2019   PLT 216 03/02/2019   Lab Results  Component Value Date   INR 0.9 02/22/2019   Lab Results  Component Value Date   NA 138  03/02/2019   K 4.2 03/02/2019   CL 106 03/02/2019   CO2 27 03/02/2019   BUN 20 03/02/2019   CREATININE 0.82 03/02/2019   GLUCOSE 124 (H) 03/02/2019    Discharge Medications:   Allergies as of 03/02/2019      Reactions   Oxycodone Nausea Only   "doesn't agree with me"       Medication List    TAKE these medications   aspirin 81 MG chewable tablet Chew 1 tablet (81 mg total) by mouth 2 (two) times daily.   docusate sodium 100 MG capsule Commonly known as: COLACE Take 1 capsule (100 mg total) by mouth 2 (two) times daily.   HYDROcodone-acetaminophen 5-325 MG tablet Commonly known as: NORCO/VICODIN Take 1-2 tablets by mouth every 4 (four) hours as needed for moderate pain (pain score 4-6).   LORazepam 0.5 MG tablet Commonly known as: ATIVAN TAKE ONE TABLET BY MOUTH TWICE A DAY AS NEEDED What changed:   when to take this  reasons to take this   losartan 100 MG tablet Commonly known as: COZAAR Take 1/2 tablet by mouth daily What changed:   how much to take  how to take this  when to take this   Omega-3 1000 MG Caps Take 1,000 mg by mouth daily.   ondansetron 4 MG tablet Commonly known as: ZOFRAN  Take 1 tablet (4 mg total) by mouth every 6 (six) hours as needed for nausea.   senna 8.6 MG Tabs tablet Commonly known as: SENOKOT Take 1 tablet (8.6 mg total) by mouth 2 (two) times daily.   Turmeric 500 MG Caps Take 500 mg by mouth daily.       Diagnostic Studies: Dg Knee Left Port  Result Date: 03/01/2019 CLINICAL DATA:  Status post left knee replacement. EXAM: PORTABLE LEFT KNEE - 1-2 VIEW COMPARISON:  None. FINDINGS: Left total knee arthroplasty. The knee is located. Negative for a periprosthetic fracture. Gas in the soft tissues and compatible with recent surgery. IMPRESSION: Expected changes from a left total knee arthroplasty. Electronically Signed   By: Markus Daft M.D.   On: 03/01/2019 17:00    Disposition: Discharge disposition: 01-Home or Self  Care       Discharge Instructions    Call MD / Call 911   Complete by: As directed    If you experience chest pain or shortness of breath, CALL 911 and be transported to the hospital emergency room.  If you develope a fever above 101 F, pus (white drainage) or increased drainage or redness at the wound, or calf pain, call your surgeon's office.   Constipation Prevention   Complete by: As directed    Drink plenty of fluids.  Prune juice may be helpful.  You may use a stool softener, such as Colace (over the counter) 100 mg twice a day.  Use MiraLax (over the counter) for constipation as needed.   Diet - low sodium heart healthy   Complete by: As directed    Do not put a pillow under the knee. Place it under the heel.   Complete by: As directed    Driving restrictions   Complete by: As directed    No driving for 4 weeks   Increase activity slowly as tolerated   Complete by: As directed    Lifting restrictions   Complete by: As directed    No lifting for 6 weeks   TED hose   Complete by: As directed    Use stockings (TED hose) for 2 weeks on both leg(s).  You may remove them at night for sleeping.      Follow-up Information    Zakaiya Lares, Aaron Edelman, MD. Schedule an appointment as soon as possible for a visit in 2 weeks.   Specialty: Orthopedic Surgery Why: For wound re-check Contact information: [address] [place] [address] [phone]            Signed: Hilton Cork Giomar Gusler 03/02/2019, 10:46 AM

## 2019-03-02 NOTE — Progress Notes (Signed)
D/C Plan of Care: Outpatient Therapy, Starting Monday  Has DME

## 2019-03-02 NOTE — Progress Notes (Signed)
    Subjective:  Patient reports pain as mild.  Denies N/V/CP/SOB. No c/o.  Objective:   VITALS:   Vitals:   03/01/19 2208 03/02/19 0128 03/02/19 0501 03/02/19 1021  BP: 129/72 130/75 134/81 140/85  Pulse: 62 (!) 57 (!) 51 (!) 55  Resp: 16 16 16 16   Temp: 98.2 F (36.8 C) 98.1 F (36.7 C) 98 F (36.7 C) 98 F (36.7 C)  TempSrc: Oral Oral Oral Oral  SpO2: 98% 99% 100% 100%  Weight:      Height:        NAD ABD soft Sensation intact distally Intact pulses distally Dorsiflexion/Plantar flexion intact Incision: dressing C/D/I Compartment soft   Lab Results  Component Value Date   WBC 12.0 (H) 03/02/2019   HGB 11.1 (L) 03/02/2019   HCT 35.2 (L) 03/02/2019   MCV 98.9 03/02/2019   PLT 216 03/02/2019   BMET    Component Value Date/Time   NA 138 03/02/2019 0305   K 4.2 03/02/2019 0305   CL 106 03/02/2019 0305   CO2 27 03/02/2019 0305   GLUCOSE 124 (H) 03/02/2019 0305   BUN 20 03/02/2019 0305   CREATININE 0.82 03/02/2019 0305   CALCIUM 8.7 (L) 03/02/2019 0305   GFRNONAA >60 03/02/2019 0305   GFRAA >60 03/02/2019 0305     Assessment/Plan: 1 Day Post-Op   Principal Problem:   Osteoarthritis of left knee   WBAT with walker DVT ppx: Aspirin, SCDs, TEDS PO pain control PT/OT Dispo: D/C home with OPPT   Hilton Cork Shakila Mak 03/02/2019, 10:38 AM   Rod Can, MD Cell: [phone] [facility]  [place] [address], [address], [place], [place] [address] Phone: [phone] [email or web address] Facebook  [employer]

## 2019-03-02 NOTE — Progress Notes (Signed)
Physical Therapy Treatment Patient Details Name: Christopher [name] MRN: [id number] DOB: [birth year] Today's Date: 03/02/2019    History of Present Illness 63 yo male s/p L TKR on 03/01/19. PMH includes alcohol abuse, HTN, anxiety, depression, R TKR, L and R RTC repair.    PT Comments    Pt progressing well; reviewed giat/stairs/HEP; pt is ready for d/c from PT standpoint  Follow Up Recommendations  Follow surgeon's recommendation for DC plan and follow-up therapies;Supervision for mobility/OOB     Equipment Recommendations  None recommended by PT    Recommendations for Other Services       Precautions / Restrictions Precautions Precautions: Knee Restrictions Weight Bearing Restrictions: No Other Position/Activity Restrictions: WBAT    Mobility  Bed Mobility Overal bed mobility: Modified Independent                Transfers Overall transfer level: Needs assistance Equipment used: Rolling walker (2 wheeled) Transfers: Sit to/from Stand Sit to Stand: Supervision            Ambulation/Gait Ambulation/Gait assistance: Supervision;Modified independent (Device/Increase time) Gait Distance (Feet): 300 Feet Assistive device: Rolling walker (2 wheeled) Gait Pattern/deviations: Step-through pattern;Decreased stride length     General Gait Details: cues for sequence, gait progression   Stairs Stairs: Yes Stairs assistance: Min guard Stair Management: Step to pattern;Forwards;With crutches;One rail Right;One rail Left Number of Stairs: 5 General stair comments: cues for sequence and safety   Wheelchair Mobility    Modified Rankin (Stroke Patients Only)       Balance                                            Cognition Arousal/Alertness: Awake/alert Behavior During Therapy: WFL for tasks assessed/performed Overall Cognitive Status: Within Functional Limits for tasks assessed                                         Exercises Total Joint Exercises Ankle Circles/Pumps: AROM;Both;10 reps Quad Sets: AROM;Both;10 reps Short Arc Quad: AROM;Left;10 reps Heel Slides: AAROM;AROM;Left;10 reps Hip ABduction/ADduction: AROM;Left;10 reps Straight Leg Raises: AROM;Left;10 reps Goniometric ROM: 5* to 70* left knee flexion    General Comments        Pertinent Vitals/Pain Pain Assessment: 0-10 Pain Score: 3  Pain Location: L knee Pain Descriptors / Indicators: Sore Pain Intervention(s): Limited activity within patient's tolerance;Monitored during session;Premedicated before session    Home Living                      Prior Function            PT Goals (current goals can now be found in the care plan section) Acute Rehab PT Goals Patient Stated Goal: go home to family PT Goal Formulation: With patient Time For Goal Achievement: 03/08/19 Potential to Achieve Goals: Good Progress towards PT goals: Progressing toward goals    Frequency    7X/week      PT Plan Current plan remains appropriate    Co-evaluation              AM-PAC PT "6 Clicks" Mobility   Outcome Measure  Help needed turning from your back to your side while in a flat bed without using bedrails?: None Help needed moving from lying  on your back to sitting on the side of a flat bed without using bedrails?: None Help needed moving to and from a bed to a chair (including a wheelchair)?: None Help needed standing up from a chair using your arms (e.g., wheelchair or bedside chair)?: None Help needed to walk in hospital room?: A Little Help needed climbing 3-5 steps with a railing? : A Little 6 Click Score: 22    End of Session Equipment Utilized During Treatment: Gait belt Activity Tolerance: Patient tolerated treatment well Patient left: in chair;with call bell/phone within reach Nurse Communication: Other (comment)(ready for d/c) PT Visit Diagnosis: Other abnormalities of gait and mobility (R26.89);Difficulty  in walking, not elsewhere classified (R26.2)     Time: 6837-2902 PT Time Calculation (min) (ACUTE ONLY): 23 min  Charges:  $Gait Training: 8-22 mins $Therapeutic Exercise: 8-22 mins                     Kenyon Ana, PT  Pager: [phone] Acute Rehab Dept [facility]): [phone]   03/02/2019    [facility] 03/02/2019, 12:43 PM

## 2019-03-07 ENCOUNTER — Encounter: Payer: Self-pay | Admitting: Family Medicine

## 2019-03-07 MED ORDER — TEMAZEPAM 30 MG PO CAPS: 30.0000 mg | ORAL_CAPSULE | Freq: Every evening | ORAL | 2 refills | Status: DC | PRN

## 2019-03-07 NOTE — Telephone Encounter (Signed)
Dr Fry please advise. thanks 

## 2019-03-07 NOTE — Telephone Encounter (Signed)
Call in Temazepam 30 mg qhs, #30 with 2 rf

## 2019-04-03 ENCOUNTER — Encounter: Payer: Self-pay | Admitting: Family Medicine

## 2019-04-04 NOTE — Telephone Encounter (Signed)
Dr Sarajane Jews is o ut of office toll next week. Sorry you are not feeling better  I would  Advise you see your orthopedic and surgery team to evaluate for next step in help . Or you can wait until dr Sarajane Jews comes back and Let us know how we can help

## 2019-04-17 ENCOUNTER — Other Ambulatory Visit: Payer: Self-pay | Admitting: Family Medicine

## 2019-04-17 NOTE — Telephone Encounter (Signed)
Last fill 10/17/2018 Last OV 10/18/18  Ok to fill?

## 2019-07-17 ENCOUNTER — Other Ambulatory Visit: Payer: Self-pay | Admitting: Family Medicine

## 2019-07-17 NOTE — Telephone Encounter (Signed)
Okay for refill?  

## 2019-09-12 ENCOUNTER — Encounter: Payer: Self-pay | Admitting: Family Medicine

## 2019-09-12 NOTE — Telephone Encounter (Signed)
Patient has scheduled an appointment. Will send to Dr. Sarajane Jews as Juluis Rainier

## 2019-09-13 NOTE — Telephone Encounter (Signed)
Make an OV so we can discuss this

## 2019-09-20 ENCOUNTER — Ambulatory Visit: Payer: BC Managed Care – PPO | Admitting: Family Medicine

## 2019-09-27 ENCOUNTER — Telehealth: Payer: Self-pay | Admitting: *Deleted

## 2019-09-27 ENCOUNTER — Encounter: Payer: Self-pay | Admitting: Family Medicine

## 2019-09-27 ENCOUNTER — Ambulatory Visit: Payer: BC Managed Care – PPO | Admitting: Family Medicine

## 2019-09-27 ENCOUNTER — Other Ambulatory Visit: Payer: Self-pay

## 2019-09-27 VITALS — BP 130/64 | HR 56 | Temp 97.8°F | Wt 180.4 lb

## 2019-09-27 DIAGNOSIS — Z0000 Encounter for general adult medical examination without abnormal findings: Secondary | ICD-10-CM | POA: Diagnosis not present

## 2019-09-27 DIAGNOSIS — Z125 Encounter for screening for malignant neoplasm of prostate: Secondary | ICD-10-CM | POA: Diagnosis not present

## 2019-09-27 LAB — TESTOSTERONE: Testosterone: 288.59 ng/dL — ABNORMAL LOW (ref 300.00–890.00)

## 2019-09-27 LAB — LIPID PANEL
Cholesterol: 229 mg/dL — ABNORMAL HIGH (ref 0–200)
HDL: 80.2 mg/dL (ref >39.00)
LDL Cholesterol: 138 mg/dL — ABNORMAL HIGH (ref 0–99)
NonHDL: 148.79
Total CHOL/HDL Ratio: 3
Triglycerides: 55 mg/dL (ref 0.0–149.0)
VLDL: 11 mg/dL (ref 0.0–40.0)

## 2019-09-27 LAB — BASIC METABOLIC PANEL
BUN: 19 mg/dL (ref 6–23)
CO2: 29 mEq/L (ref 19–32)
Calcium: 10.1 mg/dL (ref 8.4–10.5)
Chloride: 103 mEq/L (ref 96–112)
Creatinine, Ser: 0.92 mg/dL (ref 0.40–1.50)
GFR: 83.34 mL/min (ref >60.00)
Glucose, Bld: 104 mg/dL — ABNORMAL HIGH (ref 70–99)
Potassium: 4.4 mEq/L (ref 3.5–5.1)
Sodium: 139 mEq/L (ref 135–145)

## 2019-09-27 LAB — CBC WITH DIFFERENTIAL/PLATELET
Basophils Absolute: 0.1 10*3/uL (ref 0.0–0.1)
Basophils Relative: 1 % (ref 0.0–3.0)
Eosinophils Absolute: 0.2 10*3/uL (ref 0.0–0.7)
Eosinophils Relative: 4.2 % (ref 0.0–5.0)
HCT: 43 % (ref 39.0–52.0)
Hemoglobin: 14.6 g/dL (ref 13.0–17.0)
Lymphocytes Relative: 42 % (ref 12.0–46.0)
Lymphs Abs: 2 10*3/uL (ref 0.7–4.0)
MCHC: 33.9 g/dL (ref 30.0–36.0)
MCV: 93.9 fl (ref 78.0–100.0)
Monocytes Absolute: 0.6 10*3/uL (ref 0.1–1.0)
Monocytes Relative: 11.4 % (ref 3.0–12.0)
Neutro Abs: 2 10*3/uL (ref 1.4–7.7)
Neutrophils Relative %: 41.4 % — ABNORMAL LOW (ref 43.0–77.0)
Platelets: 284 10*3/uL (ref 150.0–400.0)
RBC: 4.58 Mil/uL (ref 4.22–5.81)
RDW: 14.5 % (ref 11.5–15.5)
WBC: 4.9 10*3/uL (ref 4.0–10.5)

## 2019-09-27 LAB — TSH: TSH: 3.18 u[IU]/mL (ref 0.35–4.50)

## 2019-09-27 LAB — VITAMIN B12: Vitamin B-12: 331 pg/mL (ref 211–911)

## 2019-09-27 LAB — VITAMIN D 25 HYDROXY (VIT D DEFICIENCY, FRACTURES): VITD: 27.7 ng/mL — ABNORMAL LOW (ref 30.00–100.00)

## 2019-09-27 LAB — HEPATIC FUNCTION PANEL
ALT: 18 U/L (ref 0–53)
AST: 25 U/L (ref 0–37)
Albumin: 4.6 g/dL (ref 3.5–5.2)
Alkaline Phosphatase: 52 U/L (ref 39–117)
Bilirubin, Direct: 0.2 mg/dL (ref 0.0–0.3)
Total Bilirubin: 0.9 mg/dL (ref 0.2–1.2)
Total Protein: 7.1 g/dL (ref 6.0–8.3)

## 2019-09-27 LAB — PSA: PSA: 1.11 ng/mL (ref 0.10–4.00)

## 2019-09-27 MED ORDER — SILDENAFIL CITRATE 100 MG PO TABS: 100.0000 mg | ORAL_TABLET | Freq: Every day | ORAL | 11 refills | Status: DC | PRN

## 2019-09-27 NOTE — Telephone Encounter (Signed)
Sildenafil was sent in. Insurance will not cover any medications to treat ED.

## 2019-09-27 NOTE — Telephone Encounter (Signed)
Copied from [facility] [id number]. Topic: General - Other >> Sep 27, 2019 12:53 PM Yvette Rack wrote: Reason for CRM: Pt stated his pharmacy contacted him to advise that the Rx that was sent in today is not covered by his insurance. Pt stated he does not know the name of the medication but he would need an another Rx for an alternate medication

## 2019-09-27 NOTE — Progress Notes (Signed)
Subjective:    Patient ID: COEL LINTZ, male    DOB: [birth year], 64 y.o.   MRN: [id number]  HPI Here for a wellness exam. He had a left total knee replacement last year and this went well. His main concern today is problems achieving and maintaining erections. He says his libido is not an issue now, but he has never had erectile problems before.    Review of Systems  Constitutional: Negative.   HENT: Negative.   Eyes: Negative.   Respiratory: Negative.   Cardiovascular: Negative.   Gastrointestinal: Negative.   Genitourinary: Negative.   Musculoskeletal: Negative.   Skin: Negative.   Neurological: Negative.   Psychiatric/Behavioral: Negative.        Objective:   Physical Exam Constitutional:      General: He is not in acute distress.    Appearance: He is well-developed. He is not diaphoretic.  HENT:     Head: Normocephalic and atraumatic.     Right Ear: External ear normal.     Left Ear: External ear normal.     Nose: Nose normal.     Mouth/Throat:     Pharynx: No oropharyngeal exudate.  Eyes:     General: No scleral icterus.       Right eye: No discharge.        Left eye: No discharge.     Conjunctiva/sclera: Conjunctivae normal.     Pupils: Pupils are equal, round, and reactive to light.  Neck:     Thyroid: No thyromegaly.     Vascular: No JVD.     Trachea: No tracheal deviation.  Cardiovascular:     Rate and Rhythm: Normal rate and regular rhythm.     Heart sounds: Normal heart sounds. No murmur. No friction rub. No gallop.   Pulmonary:     Effort: Pulmonary effort is normal. No respiratory distress.     Breath sounds: Normal breath sounds. No wheezing or rales.  Chest:     Chest wall: No tenderness.  Abdominal:     General: Bowel sounds are normal. There is no distension.     Palpations: Abdomen is soft. There is no mass.     Tenderness: There is no abdominal tenderness. There is no guarding or rebound.  Genitourinary:    Penis: Normal. No  tenderness.      Testes: Normal.     Prostate: Normal.     Rectum: Normal. Guaiac result negative.  Musculoskeletal:        General: No tenderness. Normal range of motion.     Cervical back: Neck supple.  Lymphadenopathy:     Cervical: No cervical adenopathy.  Skin:    General: Skin is warm and dry.     Coloration: Skin is not pale.     Findings: No erythema or rash.  Neurological:     Mental Status: He is alert and oriented to person, place, and time.     Cranial Nerves: No cranial nerve deficit.     Motor: No abnormal muscle tone.     Coordination: Coordination normal.     Deep Tendon Reflexes: Reflexes are normal and symmetric. Reflexes normal.  Psychiatric:        Behavior: Behavior normal.        Thought Content: Thought content normal.        Judgment: Judgment normal.           Assessment & Plan:  Well exam. We discussed diet and exercise. Get fasting labs. For  the ED he will try Sildenafil.  Alysia Penna, MD

## 2019-09-28 NOTE — Telephone Encounter (Signed)
Left message for patient to call back. CRM created 

## 2019-09-28 NOTE — Telephone Encounter (Signed)
Pt called back and wanted to know if [facility] received the message about several different medication.  Pt would like to have a call back.

## 2019-10-03 NOTE — Telephone Encounter (Signed)
Left message for patient to call back  

## 2019-10-06 NOTE — Telephone Encounter (Signed)
Unable to reach patient. Message will be closed. 

## 2019-10-06 NOTE — Telephone Encounter (Signed)
Left message for patient to call back  

## 2019-12-07 ENCOUNTER — Other Ambulatory Visit: Payer: Self-pay | Admitting: Family Medicine

## 2019-12-07 ENCOUNTER — Encounter: Payer: Self-pay | Admitting: Family Medicine

## 2019-12-11 NOTE — Telephone Encounter (Signed)
Last filled 04/17/2019 Last OV 09/26/2018  Ok to fill?

## 2019-12-12 MED ORDER — LORAZEPAM 0.5 MG PO TABS: 0.5000 mg | ORAL_TABLET | Freq: Two times a day (BID) | ORAL | 5 refills | Status: DC | PRN

## 2019-12-12 NOTE — Telephone Encounter (Signed)
Done

## 2019-12-25 ENCOUNTER — Other Ambulatory Visit: Payer: Self-pay | Admitting: Family Medicine

## 2019-12-31 ENCOUNTER — Other Ambulatory Visit: Payer: Self-pay | Admitting: Family Medicine

## 2020-01-05 ENCOUNTER — Encounter: Payer: Self-pay | Admitting: Family Medicine

## 2020-01-08 MED ORDER — LOSARTAN POTASSIUM 100 MG PO TABS: ORAL_TABLET | ORAL | 3 refills | Status: DC

## 2020-04-04 IMAGING — DX PORTABLE LEFT KNEE - 1-2 VIEW
2 series · 2 of 2 positions shown · non-contrast
Comparison: None.

CLINICAL DATA: Status post left knee replacement.

EXAM:
PORTABLE LEFT KNEE - 1-2 VIEW

[knee ap]
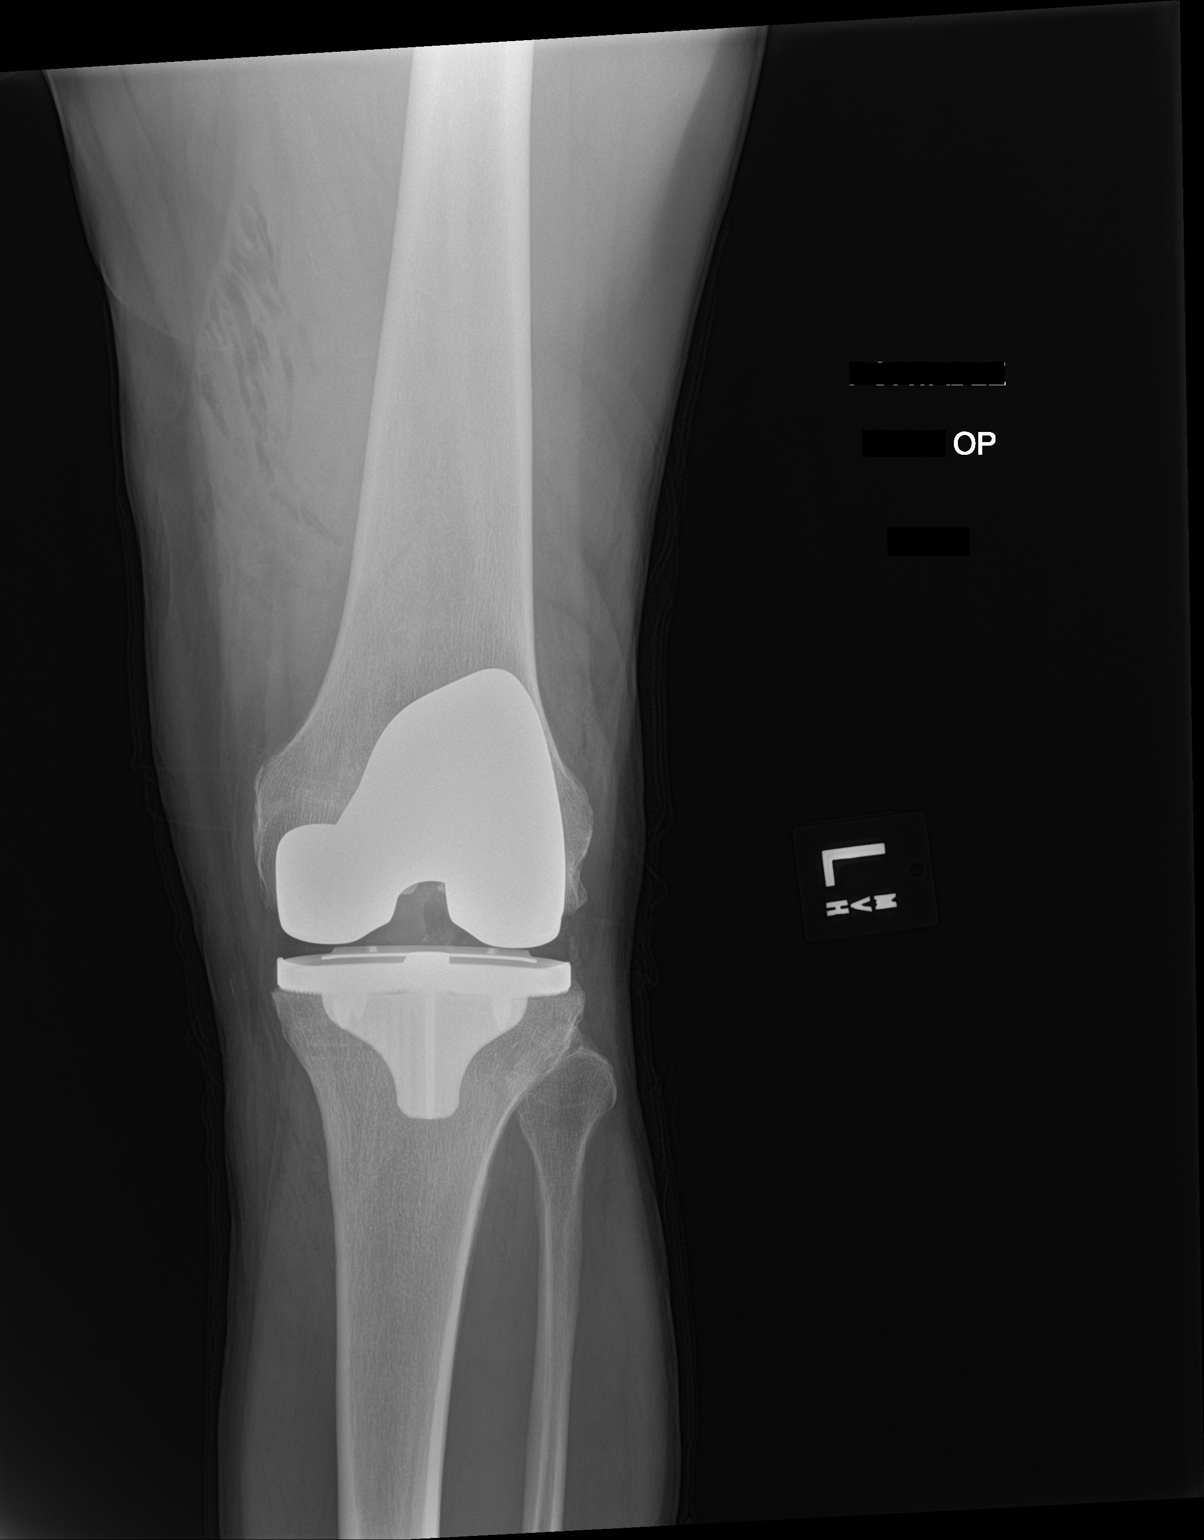

[knee lat]
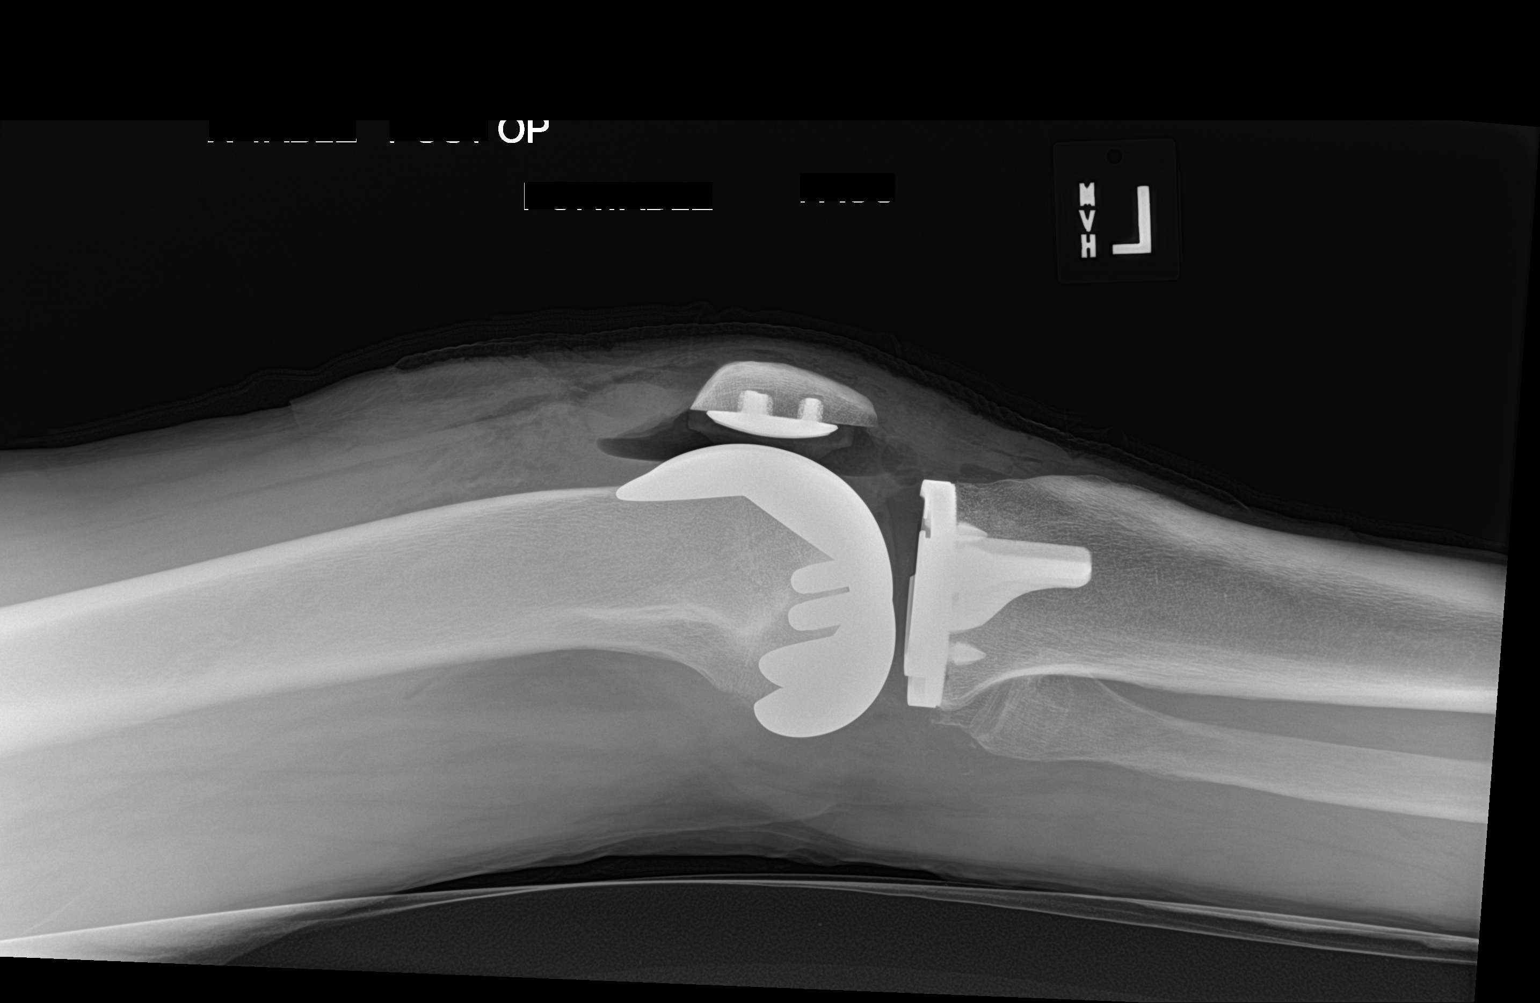

[2 of 2 positions shown; findings below may reference images not displayed]

FINDINGS: Left total knee arthroplasty. The knee is located. Negative for a
periprosthetic fracture. Gas in the soft tissues and compatible with
recent surgery.
IMPRESSION: Expected changes from a left total knee arthroplasty.

## 2020-06-07 ENCOUNTER — Other Ambulatory Visit: Payer: Self-pay | Admitting: Family Medicine

## 2020-06-07 ENCOUNTER — Other Ambulatory Visit: Payer: Self-pay

## 2020-06-07 DIAGNOSIS — I10 Essential (primary) hypertension: Secondary | ICD-10-CM

## 2020-06-07 MED ORDER — LOSARTAN POTASSIUM 100 MG PO TABS: ORAL_TABLET | ORAL | 3 refills | Status: DC

## 2020-06-17 ENCOUNTER — Other Ambulatory Visit: Payer: Self-pay

## 2020-06-17 DIAGNOSIS — I10 Essential (primary) hypertension: Secondary | ICD-10-CM

## 2020-06-17 MED ORDER — LOSARTAN POTASSIUM 50 MG PO TABS: ORAL_TABLET | ORAL | 3 refills | Status: DC

## 2020-06-20 ENCOUNTER — Encounter: Payer: Self-pay | Admitting: Family Medicine

## 2020-06-20 NOTE — Telephone Encounter (Signed)
Please call and schedule patient an appointment to evaluate blood pressure.

## 2020-06-21 NOTE — Telephone Encounter (Signed)
Set up an OV

## 2020-06-26 ENCOUNTER — Ambulatory Visit (INDEPENDENT_AMBULATORY_CARE_PROVIDER_SITE_OTHER): Payer: BC Managed Care – PPO | Admitting: Family Medicine

## 2020-06-26 ENCOUNTER — Encounter: Payer: Self-pay | Admitting: Family Medicine

## 2020-06-26 ENCOUNTER — Other Ambulatory Visit: Payer: Self-pay

## 2020-06-26 VITALS — BP 120/70 | HR 55 | Temp 97.8°F | Ht 73.0 in | Wt 178.8 lb

## 2020-06-26 DIAGNOSIS — I10 Essential (primary) hypertension: Secondary | ICD-10-CM

## 2020-06-26 NOTE — Progress Notes (Signed)
   Subjective:    Patient ID: Christopher [name], male    DOB: 09/27/1955, 64 y.o.   MRN: [id number]  HPI Here with concerns about his BP. He feels fine. He has a wrist cuff at home and when he checks this the BP is often in the 784R systolic. He sometimes has nurses at work check it, and at these times it is normal. Here today it looks great.    Review of Systems  Constitutional: Negative.   Respiratory: Negative.   Cardiovascular: Negative.        Objective:   Physical Exam Constitutional:      Appearance: Normal appearance.  Cardiovascular:     Rate and Rhythm: Normal rate and regular rhythm.     Pulses: Normal pulses.     Heart sounds: Normal heart sounds.  Pulmonary:     Effort: Pulmonary effort is normal.     Breath sounds: Normal breath sounds.  Neurological:     Mental Status: He is alert.           Assessment & Plan:  His HTN seems to be well controlled. I advised him that wrist cuffs are often not accurate, and he plans to purchase an arm cuff to use at home instead.  Alysia Penna, MD

## 2020-07-10 ENCOUNTER — Other Ambulatory Visit: Payer: Self-pay | Admitting: Family Medicine

## 2020-07-12 ENCOUNTER — Telehealth: Payer: Self-pay | Admitting: Family Medicine

## 2020-07-12 NOTE — Telephone Encounter (Signed)
Pt call and stated he is out of  temazepam (RESTORIL) 30 MG capsule and need a refill sent to  [facility] [address], [place] Phone:  [phone]  Fax:  [phone]

## 2020-07-12 NOTE — Telephone Encounter (Signed)
Last OV 06/26/20 Last refill 06/27/19  Please advise

## 2020-07-15 MED ORDER — TEMAZEPAM 30 MG PO CAPS: 30.0000 mg | ORAL_CAPSULE | Freq: Every evening | ORAL | 5 refills | Status: DC | PRN

## 2020-07-15 NOTE — Telephone Encounter (Signed)
Done

## 2020-09-13 ENCOUNTER — Encounter: Payer: BC Managed Care – PPO | Admitting: Family Medicine

## 2020-10-17 ENCOUNTER — Other Ambulatory Visit: Payer: Self-pay | Admitting: Family Medicine

## 2020-10-24 ENCOUNTER — Encounter: Payer: Self-pay | Admitting: Family Medicine

## 2020-10-25 NOTE — Telephone Encounter (Signed)
It would be very unlikely that he is getting "long haul" Covid symptoms. The smell and taste almost always return. We are reserving the monoclonal antibody treatment for patients that are very sick because it is in very short supply.  The antiviral medications are indicated only within the first 5 days of symptoms

## 2020-11-04 ENCOUNTER — Encounter: Payer: Self-pay | Admitting: Family Medicine

## 2020-11-04 DIAGNOSIS — I10 Essential (primary) hypertension: Secondary | ICD-10-CM

## 2020-11-05 NOTE — Telephone Encounter (Signed)
I put in the orders. He can set up a lab appt

## 2020-11-06 ENCOUNTER — Other Ambulatory Visit: Payer: Self-pay

## 2020-11-07 ENCOUNTER — Other Ambulatory Visit (INDEPENDENT_AMBULATORY_CARE_PROVIDER_SITE_OTHER): Payer: BC Managed Care – PPO

## 2020-11-07 DIAGNOSIS — I10 Essential (primary) hypertension: Secondary | ICD-10-CM

## 2020-11-07 LAB — CBC WITH DIFFERENTIAL/PLATELET
Basophils Absolute: 0.1 10*3/uL (ref 0.0–0.1)
Basophils Relative: 1.1 % (ref 0.0–3.0)
Eosinophils Absolute: 0.2 10*3/uL (ref 0.0–0.7)
Eosinophils Relative: 4 % (ref 0.0–5.0)
HCT: 41.3 % (ref 39.0–52.0)
Hemoglobin: 14 g/dL (ref 13.0–17.0)
Lymphocytes Relative: 41.3 % (ref 12.0–46.0)
Lymphs Abs: 1.9 10*3/uL (ref 0.7–4.0)
MCHC: 33.8 g/dL (ref 30.0–36.0)
MCV: 93.8 fl (ref 78.0–100.0)
Monocytes Absolute: 0.6 10*3/uL (ref 0.1–1.0)
Monocytes Relative: 12.1 % — ABNORMAL HIGH (ref 3.0–12.0)
Neutro Abs: 1.9 10*3/uL (ref 1.4–7.7)
Neutrophils Relative %: 41.5 % — ABNORMAL LOW (ref 43.0–77.0)
Platelets: 223 10*3/uL (ref 150.0–400.0)
RBC: 4.4 Mil/uL (ref 4.22–5.81)
RDW: 13.5 % (ref 11.5–15.5)
WBC: 4.6 10*3/uL (ref 4.0–10.5)

## 2020-11-07 LAB — LIPID PANEL
Cholesterol: 224 mg/dL — ABNORMAL HIGH (ref 0–200)
HDL: 72.1 mg/dL (ref >39.00)
LDL Cholesterol: 142 mg/dL — ABNORMAL HIGH (ref 0–99)
NonHDL: 151.66
Total CHOL/HDL Ratio: 3
Triglycerides: 49 mg/dL (ref 0.0–149.0)
VLDL: 9.8 mg/dL (ref 0.0–40.0)

## 2020-11-07 LAB — BASIC METABOLIC PANEL
BUN: 23 mg/dL (ref 6–23)
CO2: 28 mEq/L (ref 19–32)
Calcium: 9.3 mg/dL (ref 8.4–10.5)
Chloride: 103 mEq/L (ref 96–112)
Creatinine, Ser: 1.02 mg/dL (ref 0.40–1.50)
GFR: 78.39 mL/min (ref >60.00)
Glucose, Bld: 87 mg/dL (ref 70–99)
Potassium: 4.1 mEq/L (ref 3.5–5.1)
Sodium: 137 mEq/L (ref 135–145)

## 2020-11-08 ENCOUNTER — Encounter: Payer: Self-pay | Admitting: Family Medicine

## 2020-11-08 NOTE — Telephone Encounter (Signed)
I would not worry too much about the neutrophil levels as long as they are stable. I think these are what is actually normal for him

## 2020-11-08 NOTE — Telephone Encounter (Signed)
Spoke with pt reviewed Dr Sarajane Jews advise with pt verbalized understanding

## 2021-01-31 ENCOUNTER — Other Ambulatory Visit: Payer: Self-pay | Admitting: Family Medicine

## 2021-01-31 NOTE — Telephone Encounter (Signed)
Pt LOV was 06/26/2020 Last refill was done on 07/15/2020 for 30 with 5 refills Please advise

## 2021-02-03 ENCOUNTER — Other Ambulatory Visit: Payer: Self-pay | Admitting: Family Medicine

## 2021-02-04 ENCOUNTER — Telehealth: Payer: Self-pay

## 2021-02-05 ENCOUNTER — Other Ambulatory Visit: Payer: Self-pay | Admitting: Family Medicine

## 2021-02-05 NOTE — Telephone Encounter (Signed)
Pt call and stated the pharmacy fill the wrong Prescription he want Lorazepam and not temazepam and want it call back in to  [facility] [address] [place], [place] Phone:  [phone]  Fax:  [phone]

## 2021-02-05 NOTE — Telephone Encounter (Signed)
Pt LOV was 06/26/20 Last refill was done on 12/11/2020 for 60 tablet Please advise

## 2021-02-06 MED ORDER — LORAZEPAM 0.5 MG PO TABS: 0.5000 mg | ORAL_TABLET | Freq: Two times a day (BID) | ORAL | 5 refills | Status: DC | PRN

## 2021-02-14 NOTE — Telephone Encounter (Signed)
Pt Rx was sent to pharmacy, nothing further needed

## 2021-03-06 ENCOUNTER — Encounter: Payer: Self-pay | Admitting: Family Medicine

## 2021-03-07 ENCOUNTER — Other Ambulatory Visit: Payer: Self-pay

## 2021-03-07 ENCOUNTER — Telehealth: Payer: Self-pay | Admitting: Internal Medicine

## 2021-03-07 ENCOUNTER — Encounter: Payer: Self-pay | Admitting: Internal Medicine

## 2021-03-07 MED ORDER — HYDROCORTISONE ACETATE 25 MG RE SUPP: 25.0000 mg | Freq: Every evening | RECTAL | 0 refills | Status: DC

## 2021-03-07 NOTE — Telephone Encounter (Signed)
Doc Of the Day please advise:  Spoke with patient, patient states he noticed hemorrhoids two days ago. Says he does not have a history of this, but his brother does. States he took a picture of the Hemorid and looked it up online as well as showing his sister in law whom is a PA and they both agreed that it was "prolapsed hemorrhoids." Patient reports he has "super intense" pain especially when walking down the steps, however is not having any bleeding at this time. Reports no change in diet, just appeared "out of the blue" Pt states he has tried Prep H with no relief. Per sister in law he needs a suppository.Pt would like "some sort" of Rx sent to pharmacy for relief over the weekend and possible OV next week if needed.

## 2021-03-07 NOTE — Telephone Encounter (Signed)
The patient would like to have something sent to the pharmacy today. Or either have the medical assistant to give him a call  [place] [id number], [place] Phone:  [phone]  Fax:  [phone]

## 2021-03-07 NOTE — Telephone Encounter (Signed)
See My Chart Message as matter has been addressed.

## 2021-03-07 NOTE — Telephone Encounter (Signed)
Pt returned you call. Pls call pt again.

## 2021-03-07 NOTE — Telephone Encounter (Signed)
Attempted to contact patient, no answer LMTCB 

## 2021-03-07 NOTE — Telephone Encounter (Signed)
Ok for [employer] supp 25 mg qHS x 3-5 night OTC recticare per tube instruction PRN pain Sitz baths Difficult to know if he is having internal or external hemorrhoidal pain without exam, though with holiday weekend if pain worsening and unbearable then he should be seen in urgent care for incision/drainage  JMP

## 2021-03-07 NOTE — Telephone Encounter (Signed)
Pt states that hemorrhoids has gotten worse lately. Pain is very bad and nothing that he has applied works.He made an appt with Dr. Henrene Pastor on 04/15/21 but is requesting something to relief pain before that. His pharmacy is still [facility]. Pls call him.

## 2021-03-11 ENCOUNTER — Telehealth: Payer: Self-pay

## 2021-03-11 NOTE — Telephone Encounter (Signed)
The patient has internal hemorrhoids noted on colonoscopy in 2019.  Please set him up for an office hemorrhoid banding session with Dr. Hilarie Fredrickson.  Thank you.

## 2021-03-11 NOTE — Telephone Encounter (Signed)
Pt sent the following message via MY CHART:   "Thank you for scheduling this appointment.  My concern is that it is three weeks away.  Will this be too long to wait if it has thrombosed?  It seems to be external not internal.  Very uncomfortable!"

## 2021-03-11 NOTE — Telephone Encounter (Signed)
Called patient and left message on his voice mail that we have scheduled the procedure he requested on 04/01/21 at 3:40 pm. To please call us back if any questions about this

## 2021-03-11 NOTE — Telephone Encounter (Signed)
Called patient and he states his hemorrhoid was internal and prolapsed over the weekend. It is a single one about 1 inch x 1/2 inch. States the suppositories ordered for him have helped some with the pain, but he would like to get this excised. Please advise.

## 2021-03-12 NOTE — Telephone Encounter (Signed)
See my chart message

## 2021-04-01 ENCOUNTER — Encounter: Payer: Self-pay | Admitting: Gastroenterology

## 2021-04-01 ENCOUNTER — Ambulatory Visit: Payer: BC Managed Care – PPO | Admitting: Gastroenterology

## 2021-04-01 VITALS — BP 150/72 | HR 60 | Ht 72.0 in | Wt 174.4 lb

## 2021-04-01 DIAGNOSIS — K645 Perianal venous thrombosis: Secondary | ICD-10-CM | POA: Diagnosis not present

## 2021-04-01 NOTE — Progress Notes (Signed)
    History of Present Illness: This is a 65 year old male who relates the sudden onset of rectal pain and discomfort several weeks ago.  He provides a photograph on his phone that he took the day his symptoms began which shows a thrombosed hemorrhoid.  He states with hemorrhoidal suppositories and lidocaine cream his symptoms gradually resolved over the course of the week and have now totally abated.  He occasionally has had significant straining with bowel movements. He began taking psyllium and extra water daily and now has no difficulty with hard stools or straining.  He has no other ongoing colorectal complaints.  This is the first time he has had difficulties with hemorrhoids.  Colonoscopy by Dr. Henrene Pastor in 2019 showed 3 small tubular adenomas, mild sigmoid diverticulosis and internal hemorrhoids.   Current Medications, Allergies, Past Medical History, Past Surgical History, Family History and Social History were reviewed in [employer] record.   Physical Exam: General: Well developed, well nourished, no acute distress Head: Normocephalic and atraumatic Eyes: Sclerae anicteric, EOMI Ears: Normal auditory acuity Mouth: Not examined, mask on during Covid-19 pandemic Lungs: Clear throughout to auscultation Heart: Regular rate and rhythm; no murmurs, rubs or bruits Abdomen: Soft, non tender and non distended. No masses, hepatosplenomegaly or hernias noted. Normal Bowel sounds Rectal: Small external tags, no anal canal tenderness, no internal lesions Musculoskeletal: Symmetrical with no gross deformities  Pulses:  Normal pulses noted Extremities: No clubbing, cyanosis, edema or deformities noted Neurological: Alert oriented x 4, grossly nonfocal Psychological:  Alert and cooperative. Normal mood and affect   Assessment and Recommendations:  Thrombosed hemorrhoid, resolved.  Small internal hemorrhoids.  Given this is the first time he has had significant hemorrhoid  symptoms recommend conservative management. Advised to continue daily psyllium and adequate daily water intake long-term to avoid straining with bowel movements, hard stools, constipation.  If he has recurrent problems with hemorrhoidal symptoms consider hemorrhoidal banding in the future.  Follow-up with Dr. Henrene Pastor as needed. Personal history of adenomatous colon polyps.  Surveillance colonoscopy in May 2024 as recommended by Dr. Henrene Pastor.

## 2021-04-01 NOTE — Patient Instructions (Signed)
Follow up with Dr. Henrene Pastor  Thank you for choosing me and [facility].  Pricilla Riffle. Dagoberto Ligas., MD., Marval Regal

## 2021-04-15 ENCOUNTER — Ambulatory Visit: Payer: BC Managed Care – PPO | Admitting: Internal Medicine

## 2021-04-25 ENCOUNTER — Encounter: Payer: Self-pay | Admitting: Family Medicine

## 2021-04-25 ENCOUNTER — Ambulatory Visit: Payer: BC Managed Care – PPO | Admitting: Family Medicine

## 2021-04-25 ENCOUNTER — Other Ambulatory Visit: Payer: Self-pay

## 2021-04-25 VITALS — BP 120/70 | HR 50 | Temp 97.5°F | Wt 173.7 lb

## 2021-04-25 DIAGNOSIS — H5789 Other specified disorders of eye and adnexa: Secondary | ICD-10-CM

## 2021-04-25 NOTE — Progress Notes (Signed)
Established Patient Office Visit  Subjective:  Patient ID: Christopher [name], male    DOB: [birth year]  Age: 65 y.o. MRN: [id number]  CC:  Chief Complaint  Patient presents with   Eye Pain    Left eye, itching/irritated, x 3 weeks    HPI Christopher [name] presents for left eye irritation laterally for the about the past 3 weeks.  Denies any injury.  Irritation seems to be around the lateral canthus region.  He tried some irrigation over-the-counter which does not seem to make much difference.  Denies any visual changes.  No eye discharge.  Mild itching.  No contact use.  He is not see any styes.  Minimal if any swelling.  He does do some woodworking but does not recall any specific foreign body.  He does have scheduled follow-up next week with optometrist.  He could not get in any sooner.  Past Medical History:  Diagnosis Date   Arthritis    Colon polyps 09/26/2012   TUBULAR ADENOMA (X1)   Depression    Hypertension    Insomnia     Past Surgical History:  Procedure Laterality Date   COLONOSCOPY  09-26-12   per Dr. Henrene Pastor, adenomatous polyps, repeat in 5 yrs    COLONOSCOPY  01/17/2018   per Dr. Henrene Pastor, adenomatous polyps, repeat in 5 yrs    EYE SURGERY     for glaucoma   KNEE ARTHROPLASTY Left 03/01/2019   Procedure: COMPUTER ASSISTED TOTAL KNEE ARTHROPLASTY;  Surgeon: Rod Can, MD;  Location: [facility];  Service: Orthopedics;  Laterality: Left;   REPLACEMENT TOTAL KNEE     right   ROTATOR CUFF REPAIR     right   ROTATOR CUFF REPAIR     left   TOE SURGERY  08/2018   Dr Doran Durand    WISDOM TOOTH EXTRACTION      Family History  Problem Relation Age of Onset   Hypertension Father    Hypertension Brother    Gallbladder disease Brother    Colon polyps Maternal Uncle        familal polyposis   Colon cancer Maternal Aunt    Other Neg Hx        hypogonadism   Stomach cancer Neg Hx    Esophageal cancer Neg Hx    Pancreatic cancer Neg Hx    Liver disease Neg Hx     Social  History   Socioeconomic History   Marital status: Married    Spouse name: Not on file   Number of children: Not on file   Years of education: Not on file   Highest education level: Not on file  Occupational History   Not on file  Tobacco Use   Smoking status: Never   Smokeless tobacco: Never  Vaping Use   Vaping Use: Never used  Substance and Sexual Activity   Alcohol use: Yes    Alcohol/week: 10.0 standard drinks    Types: 10 Standard drinks or equivalent per week    Comment: 2-3 drinks every night   Drug use: No   Sexual activity: Not on file  Other Topics Concern   Not on file  Social History Narrative   Not on file   Social Determinants of Health   Financial Resource Strain: Not on file  Food Insecurity: Not on file  Transportation Needs: Not on file  Physical Activity: Not on file  Stress: Not on file  Social Connections: Not on file  Intimate Partner Violence: Not  on file    Outpatient Medications Prior to Visit  Medication Sig Dispense Refill   Omega-3 1000 MG CAPS Take 1,000 mg by mouth daily.     Psyllium 100 % POWD Take by mouth as directed.     Turmeric 500 MG CAPS Take 500 mg by mouth daily.     No facility-administered medications prior to visit.    No Known Allergies  ROS Review of Systems  Eyes:  Positive for itching. Negative for discharge and visual disturbance.     Objective:    Physical Exam Vitals reviewed.  Eyes:     Extraocular Movements: Extraocular movements intact.     Conjunctiva/sclera: Conjunctivae normal.     Pupils: Pupils are equal, round, and reactive to light.     Comments: Fundi benign left eye.  Conjunctive appears normal.  No obvious drainage.  Does not have any scaling or any rash involving the eyelids.  Lateral canthus region is examined and no obvious signs of trauma.  We performed stain with fluorescein and did not see any corneal defects or any obvious foreign body including the left upper lid  Cardiovascular:      Rate and Rhythm: Normal rate.  Neurological:     Mental Status: He is alert.    BP 120/70 (BP Location: Left Arm, Patient Position: Sitting, Cuff Size: Normal)   Pulse (!) 50   Temp (!) 97.5 F (36.4 C) (Oral)   Wt 173 lb 11.2 oz (78.8 kg)   SpO2 99%   BMI 23.56 kg/m  Wt Readings from Last 3 Encounters:  04/25/21 173 lb 11.2 oz (78.8 kg)  04/01/21 174 lb 6.4 oz (79.1 kg)  06/26/20 178 lb 12.8 oz (81.1 kg)     Health Maintenance Due  Topic Date Due   Pneumococcal Vaccine 41-35 Years old (1 - PCV) Never done   HIV Screening  Never done   Hepatitis C Screening  Never done   Zoster Vaccines- Shingrix (1 of 2) Never done   COVID-19 Vaccine (3 - Booster for Moderna series) 05/13/2020   INFLUENZA VACCINE  04/07/2021    There are no preventive care reminders to display for this patient.  Lab Results  Component Value Date   TSH 3.18 09/27/2019   Lab Results  Component Value Date   WBC 4.6 11/07/2020   HGB 14.0 11/07/2020   HCT 41.3 11/07/2020   MCV 93.8 11/07/2020   PLT 223.0 11/07/2020   Lab Results  Component Value Date   NA 137 11/07/2020   K 4.1 11/07/2020   CO2 28 11/07/2020   GLUCOSE 87 11/07/2020   BUN 23 11/07/2020   CREATININE 1.02 11/07/2020   BILITOT 0.9 09/27/2019   ALKPHOS 52 09/27/2019   AST 25 09/27/2019   ALT 18 09/27/2019   PROT 7.1 09/27/2019   ALBUMIN 4.6 09/27/2019   CALCIUM 9.3 11/07/2020   ANIONGAP 5 03/02/2019   GFR 78.39 11/07/2020   Lab Results  Component Value Date   CHOL 224 (H) 11/07/2020   Lab Results  Component Value Date   HDL 72.10 11/07/2020   Lab Results  Component Value Date   LDLCALC 142 (H) 11/07/2020   Lab Results  Component Value Date   TRIG 49.0 11/07/2020   Lab Results  Component Value Date   CHOLHDL 3 11/07/2020   Lab Results  Component Value Date   HGBA1C 5.5 01/25/2009      Assessment & Plan:   Left eye irritation involving mostly the lateral canthus  region.  No evidence for trauma.   Conjunctive appears normal.  No obvious foreign body.  Cornea appears normal.  Fluorescein exam is unremarkable.  No periocular rashes.  -Recommend observation for now.  Follow-up immediately for any blurred vision or other changes.  He will follow-up with optometrist next week and sooner as needed.   we do not see any clear indication for antibiotic or steroid drops at this time  No orders of the defined types were placed in this encounter.   Follow-up: No follow-ups on file.    Carolann Littler, MD

## 2021-04-25 NOTE — Patient Instructions (Signed)
No foreign bodies seen  Please let us know if you have any blurred vision or progressive pain or swelling.

## 2021-08-28 ENCOUNTER — Ambulatory Visit: Payer: BC Managed Care – PPO | Admitting: Family Medicine

## 2021-08-28 ENCOUNTER — Encounter: Payer: Self-pay | Admitting: Family Medicine

## 2021-08-28 VITALS — BP 126/82 | HR 47 | Temp 97.7°F | Wt 173.0 lb

## 2021-08-28 DIAGNOSIS — I10 Essential (primary) hypertension: Secondary | ICD-10-CM

## 2021-08-28 DIAGNOSIS — S39012A Strain of muscle, fascia and tendon of lower back, initial encounter: Secondary | ICD-10-CM

## 2021-08-28 DIAGNOSIS — Z125 Encounter for screening for malignant neoplasm of prostate: Secondary | ICD-10-CM

## 2021-08-28 DIAGNOSIS — E538 Deficiency of other specified B group vitamins: Secondary | ICD-10-CM

## 2021-08-28 DIAGNOSIS — G4730 Sleep apnea, unspecified: Secondary | ICD-10-CM

## 2021-08-28 DIAGNOSIS — R739 Hyperglycemia, unspecified: Secondary | ICD-10-CM

## 2021-08-28 DIAGNOSIS — E291 Testicular hypofunction: Secondary | ICD-10-CM

## 2021-08-28 DIAGNOSIS — E559 Vitamin D deficiency, unspecified: Secondary | ICD-10-CM

## 2021-08-28 MED ORDER — METHYLPREDNISOLONE 4 MG PO TBPK: ORAL_TABLET | ORAL | 0 refills | Status: DC

## 2021-08-28 MED ORDER — CYCLOBENZAPRINE HCL 10 MG PO TABS: 10.0000 mg | ORAL_TABLET | Freq: Three times a day (TID) | ORAL | 0 refills | Status: DC | PRN
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
## 2021-09-06 ENCOUNTER — Other Ambulatory Visit: Payer: Self-pay | Admitting: Family Medicine

## 2021-09-09 NOTE — Telephone Encounter (Signed)
Last OV- 08/28/21 Medication currently not on med list.  No future OV scheduled. Can this patient receive a refill?

## 2021-09-23 ENCOUNTER — Other Ambulatory Visit: Payer: Self-pay | Admitting: Family Medicine

## 2021-09-23 NOTE — Telephone Encounter (Signed)
Spoke with pt states that he wants to go back on Sildenafil, Rx not on pt medication list,ok to send refill

## 2021-10-02 ENCOUNTER — Other Ambulatory Visit (INDEPENDENT_AMBULATORY_CARE_PROVIDER_SITE_OTHER): Payer: BC Managed Care – PPO

## 2021-10-02 DIAGNOSIS — E559 Vitamin D deficiency, unspecified: Secondary | ICD-10-CM | POA: Diagnosis not present

## 2021-10-02 DIAGNOSIS — E291 Testicular hypofunction: Secondary | ICD-10-CM | POA: Diagnosis not present

## 2021-10-02 DIAGNOSIS — I10 Essential (primary) hypertension: Secondary | ICD-10-CM

## 2021-10-02 DIAGNOSIS — E538 Deficiency of other specified B group vitamins: Secondary | ICD-10-CM | POA: Diagnosis not present

## 2021-10-02 DIAGNOSIS — R739 Hyperglycemia, unspecified: Secondary | ICD-10-CM | POA: Diagnosis not present

## 2021-10-02 DIAGNOSIS — Z125 Encounter for screening for malignant neoplasm of prostate: Secondary | ICD-10-CM

## 2021-10-02 LAB — VITAMIN B12: Vitamin B-12: 315 pg/mL (ref 211–911)

## 2021-10-02 LAB — LIPID PANEL
Cholesterol: 199 mg/dL (ref 0–200)
HDL: 77.7 mg/dL (ref >39.00)
LDL Cholesterol: 114 mg/dL — ABNORMAL HIGH (ref 0–99)
NonHDL: 121.78
Total CHOL/HDL Ratio: 3
Triglycerides: 41 mg/dL (ref 0.0–149.0)
VLDL: 8.2 mg/dL (ref 0.0–40.0)

## 2021-10-02 LAB — BASIC METABOLIC PANEL
BUN: 17 mg/dL (ref 6–23)
CO2: 29 mEq/L (ref 19–32)
Calcium: 9.4 mg/dL (ref 8.4–10.5)
Chloride: 104 mEq/L (ref 96–112)
Creatinine, Ser: 1.03 mg/dL (ref 0.40–1.50)
GFR: 76.99 mL/min (ref >60.00)
Glucose, Bld: 93 mg/dL (ref 70–99)
Potassium: 4.5 mEq/L (ref 3.5–5.1)
Sodium: 140 mEq/L (ref 135–145)

## 2021-10-02 LAB — CBC WITH DIFFERENTIAL/PLATELET
Basophils Absolute: 0 10*3/uL (ref 0.0–0.1)
Basophils Relative: 1.2 % (ref 0.0–3.0)
Eosinophils Absolute: 0.2 10*3/uL (ref 0.0–0.7)
Eosinophils Relative: 4.6 % (ref 0.0–5.0)
HCT: 41.8 % (ref 39.0–52.0)
Hemoglobin: 13.9 g/dL (ref 13.0–17.0)
Lymphocytes Relative: 41.6 % (ref 12.0–46.0)
Lymphs Abs: 1.5 10*3/uL (ref 0.7–4.0)
MCHC: 33.1 g/dL (ref 30.0–36.0)
MCV: 95.4 fl (ref 78.0–100.0)
Monocytes Absolute: 0.4 10*3/uL (ref 0.1–1.0)
Monocytes Relative: 9.7 % (ref 3.0–12.0)
Neutro Abs: 1.6 10*3/uL (ref 1.4–7.7)
Neutrophils Relative %: 42.9 % — ABNORMAL LOW (ref 43.0–77.0)
Platelets: 228 10*3/uL (ref 150.0–400.0)
RBC: 4.38 Mil/uL (ref 4.22–5.81)
RDW: 13.8 % (ref 11.5–15.5)
WBC: 3.6 10*3/uL — ABNORMAL LOW (ref 4.0–10.5)

## 2021-10-02 LAB — HEMOGLOBIN A1C: Hgb A1c MFr Bld: 5.2 % (ref 4.6–6.5)

## 2021-10-02 LAB — TESTOSTERONE: Testosterone: 291.87 ng/dL — ABNORMAL LOW (ref 300.00–890.00)

## 2021-10-02 LAB — HEPATIC FUNCTION PANEL
ALT: 16 U/L (ref 0–53)
AST: 24 U/L (ref 0–37)
Albumin: 4.3 g/dL (ref 3.5–5.2)
Alkaline Phosphatase: 45 U/L (ref 39–117)
Bilirubin, Direct: 0.1 mg/dL (ref 0.0–0.3)
Total Bilirubin: 0.6 mg/dL (ref 0.2–1.2)
Total Protein: 6.8 g/dL (ref 6.0–8.3)

## 2021-10-02 LAB — VITAMIN D 25 HYDROXY (VIT D DEFICIENCY, FRACTURES): VITD: 30.97 ng/mL (ref 30.00–100.00)

## 2021-10-02 LAB — TSH: TSH: 3.44 u[IU]/mL (ref 0.35–5.50)

## 2021-10-02 LAB — PSA: PSA: 1.35 ng/mL (ref 0.10–4.00)

## 2021-10-02 NOTE — Addendum Note (Signed)
Addended by: Alysia Penna A on: 10/02/2021 08:16 AM   Modules accepted: Orders

## 2021-10-04 NOTE — Progress Notes (Signed)
10/06/21- 65 yoM never smoker for sleep evaluation courtesy of Dr Sarajane Jews with concern of OSA Medical problem list includes HTN, Hypogonadism, ETOH, Anemia, Anxiety, Glaucoma, Insomnia,  Meds include temazepam 30,  Epworth score-2 Body weight today- 175 lbs Covid vax- 2 Moderna Flu vax-had He describes episodes once or twice a year when he wakes up and feels he cannot inhale-very alarming but transient.  Aware of snoring and occasional gasping. Uses occasional Ativan for sleep.  Cut morning coffee. Brother has OSA. Patient denies lung or heart problems.  ENT surgery limited to septoplasty. 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  Prior to Admission medications   Medication Sig Start Date End Date Taking? Authorizing Provider  cyclobenzaprine (FLEXERIL) 10 MG tablet Take 1 tablet (10 mg total) by mouth 3 (three) times daily as needed for muscle spasms. 08/28/21  Yes Laurey Morale, MD  methylPREDNISolone (MEDROL DOSEPAK) 4 MG TBPK tablet As directed 08/28/21  Yes Laurey Morale, MD  Omega-3 1000 MG CAPS Take 1,000 mg by mouth daily.   Yes [provider]  Psyllium 100 % POWD Take by mouth as directed.   Yes [provider]  sildenafil (VIAGRA) 100 MG tablet TAKE ONE TABLET BY MOUTH DAILY AS NEEDED FOR FOR ERECTILE DYSFUNCTION 09/24/21  Yes Laurey Morale, MD  temazepam (RESTORIL) 30 MG capsule TAKE ONE CAPSULE BY MOUTH AT BEDTIME AS NEEDED 09/10/21  Yes Laurey Morale, MD  Turmeric 500 MG CAPS Take 500 mg by mouth daily.   Yes [provider]   Past Medical History:  Diagnosis Date   Arthritis    Colon polyps 09/26/2012   TUBULAR ADENOMA (X1)   Depression    Hypertension    Insomnia    Past Surgical History:  Procedure Laterality Date   COLONOSCOPY  09-26-12   per Dr. Henrene Pastor, adenomatous polyps, repeat in 5 yrs    COLONOSCOPY  01/17/2018   per Dr. Henrene Pastor, adenomatous polyps, repeat in 5 yrs    EYE SURGERY     for glaucoma   KNEE ARTHROPLASTY  Left 03/01/2019   Procedure: COMPUTER ASSISTED TOTAL KNEE ARTHROPLASTY;  Surgeon: Rod Can, MD;  Location: [facility];  Service: Orthopedics;  Laterality: Left;   REPLACEMENT TOTAL KNEE     right   ROTATOR CUFF REPAIR     right   ROTATOR CUFF REPAIR     left   TOE SURGERY  08/2018   Dr Doran Durand    WISDOM TOOTH EXTRACTION     Family History  Problem Relation Age of Onset   Hypertension Father    Hypertension Brother    Gallbladder disease Brother    Colon polyps Maternal Uncle        familal polyposis   Colon cancer Maternal Aunt    Other Neg Hx        hypogonadism   Stomach cancer Neg Hx    Esophageal cancer Neg Hx    Pancreatic cancer Neg Hx    Liver disease Neg Hx    Social History   Socioeconomic History   Marital status: Married    Spouse name: Not on file   Number of children: Not on file   Years of education: Not on file   Highest education level: Not on file  Occupational History   Not on file  Tobacco Use   Smoking status: Never   Smokeless tobacco: Never  Vaping Use   Vaping Use: Never used  Substance and Sexual Activity  Alcohol use: Yes    Alcohol/week: 10.0 standard drinks    Types: 10 Standard drinks or equivalent per week    Comment: 2-3 drinks every night   Drug use: No   Sexual activity: Not on file  Other Topics Concern   Not on file  Social History Narrative   Not on file   Social Determinants of Health   Financial Resource Strain: Not on file  Food Insecurity: Not on file  Transportation Needs: Not on file  Physical Activity: Not on file  Stress: Not on file  Social Connections: Not on file  Intimate Partner Violence: Not on file   ROS-see HPI   + = positive Constitutional:    weight loss, night sweats, fevers, chills, fatigue, lassitude. HEENT:    headaches, difficulty swallowing, tooth/dental problems, sore throat,       sneezing, itching, ear ache, nasal congestion, post nasal drip, snoring CV:    chest pain, orthopnea, PND,  swelling in lower extremities, anasarca,                                  dizziness, palpitations Resp:   shortness of breath with exertion or at rest.                productive cough,   non-productive cough, coughing up of blood.              change in color of mucus.  wheezing.   Skin:    rash or lesions. GI:  No-   heartburn, indigestion, abdominal pain, nausea, vomiting, diarrhea,                 change in bowel habits, loss of appetite GU: dysuria, change in color of urine, no urgency or frequency.   flank pain. MS:   joint pain, stiffness, decreased range of motion, back pain. Neuro-     nothing unusual Psych:  change in mood or affect.  depression or anxiety.   memory loss.  OBJ- Physical Exam General- Alert, Oriented, Affect-appropriate, Distress- none acute, + trim Skin- rash-none, lesions- none, excoriation- none Lymphadenopathy- none Head- atraumatic            Eyes- Gross vision intact, PERRLA, conjunctivae and secretions clear            Ears- Hearing, canals-normal            Nose- Clear, no-Septal dev, mucus, polyps, erosion, perforation             Throat- Mallampati III , mucosa clear , drainage- none, tonsils- atrophic, + teeth Neck- flexible , trachea midline, no stridor , thyroid nl, carotid no bruit Chest - symmetrical excursion , unlabored           Heart/CV- RRR , no murmur , no gallop  , no rub, nl s1 s2                           - JVD- none , edema- none, stasis changes- none, varices- none           Lung- clear to P&A, wheeze- none, cough- none , dullness-none, rub- none           Chest wall-  Abd-  Br/ Gen/ Rectal- Not done, not indicated Extrem- cyanosis- none, clubbing, none, atrophy- none, strength- nl Neuro- grossly intact to observation

## 2021-10-06 ENCOUNTER — Other Ambulatory Visit: Payer: Self-pay

## 2021-10-06 ENCOUNTER — Ambulatory Visit (INDEPENDENT_AMBULATORY_CARE_PROVIDER_SITE_OTHER): Payer: BC Managed Care – PPO | Admitting: Internal Medicine

## 2021-10-06 ENCOUNTER — Encounter: Payer: Self-pay | Admitting: Internal Medicine

## 2021-10-06 VITALS — BP 124/74 | HR 70 | Temp 98.4°F | Ht 71.0 in | Wt 175.2 lb

## 2021-10-06 DIAGNOSIS — F5101 Primary insomnia: Secondary | ICD-10-CM

## 2021-10-06 DIAGNOSIS — R0683 Snoring: Secondary | ICD-10-CM | POA: Diagnosis not present

## 2021-10-06 NOTE — Patient Instructions (Signed)
Order- schedule Home Sleep Test   dx Snoring  Please call us about 2 weeks after your sleep test for result and recommendations

## 2021-10-08 ENCOUNTER — Ambulatory Visit (INDEPENDENT_AMBULATORY_CARE_PROVIDER_SITE_OTHER): Payer: BC Managed Care – PPO | Admitting: Family Medicine

## 2021-10-08 ENCOUNTER — Encounter: Payer: Self-pay | Admitting: Family Medicine

## 2021-10-08 VITALS — BP 120/80 | HR 52 | Temp 98.0°F | Ht 71.5 in | Wt 175.0 lb

## 2021-10-08 DIAGNOSIS — Z0000 Encounter for general adult medical examination without abnormal findings: Secondary | ICD-10-CM | POA: Diagnosis not present

## 2021-10-08 DIAGNOSIS — R0683 Snoring: Secondary | ICD-10-CM | POA: Insufficient documentation

## 2021-10-08 DIAGNOSIS — G4733 Obstructive sleep apnea (adult) (pediatric): Secondary | ICD-10-CM | POA: Insufficient documentation

## 2021-10-08 MED ORDER — "LUER LOCK SAFETY SYRINGES 22G X 1-1/2"" 3 ML MISC": 1.0000 "application " | 2 refills | Status: AC

## 2021-10-08 MED ORDER — TESTOSTERONE CYPIONATE 200 MG/ML IM SOLN: 200.0000 mg | INTRAMUSCULAR | 1 refills | Status: DC

## 2021-10-08 NOTE — Progress Notes (Signed)
Subjective:    Patient ID: Christopher [name], male    DOB: 1956/03/11, 66 y.o.   MRN: [id number]  HPI Here for a well exam. He feels well in general except he says his overall energy level and his libido has been dropping over the past year. He had fasting labs here last week that showed a low testosterone at 292. Otherwise the results were unremarkable. He gets good results from Sildenafil as far as erections go, but his desire has been fading. He tried a testosterone topical gel a few years ago, but he never got used to applying this on a regular basis so he stopped it.    Review of Systems  Constitutional: Negative.   HENT: Negative.    Eyes: Negative.   Respiratory: Negative.    Cardiovascular: Negative.   Gastrointestinal: Negative.   Genitourinary: Negative.   Musculoskeletal: Negative.   Skin: Negative.   Neurological: Negative.   Psychiatric/Behavioral: Negative.        Objective:   Physical Exam Constitutional:      General: He is not in acute distress.    Appearance: Normal appearance. He is well-developed. He is not diaphoretic.  HENT:     Head: Normocephalic and atraumatic.     Right Ear: External ear normal.     Left Ear: External ear normal.     Nose: Nose normal.     Mouth/Throat:     Pharynx: No oropharyngeal exudate.  Eyes:     General: No scleral icterus.       Right eye: No discharge.        Left eye: No discharge.     Conjunctiva/sclera: Conjunctivae normal.     Pupils: Pupils are equal, round, and reactive to light.  Neck:     Thyroid: No thyromegaly.     Vascular: No JVD.     Trachea: No tracheal deviation.  Cardiovascular:     Rate and Rhythm: Normal rate and regular rhythm.     Heart sounds: Normal heart sounds. No murmur heard.   No friction rub. No gallop.  Pulmonary:     Effort: Pulmonary effort is normal. No respiratory distress.     Breath sounds: Normal breath sounds. No wheezing or rales.  Chest:     Chest wall: No tenderness.   Abdominal:     General: Bowel sounds are normal. There is no distension.     Palpations: Abdomen is soft. There is no mass.     Tenderness: There is no abdominal tenderness. There is no guarding or rebound.  Genitourinary:    Penis: Normal. No tenderness.   Musculoskeletal:        General: No tenderness. Normal range of motion.     Cervical back: Neck supple.  Lymphadenopathy:     Cervical: No cervical adenopathy.  Skin:    General: Skin is warm and dry.     Coloration: Skin is not pale.     Findings: No erythema or rash.  Neurological:     Mental Status: He is alert and oriented to person, place, and time.     Cranial Nerves: No cranial nerve deficit.     Motor: No abnormal muscle tone.     Coordination: Coordination normal.     Deep Tendon Reflexes: Reflexes are normal and symmetric. Reflexes normal.  Psychiatric:        Behavior: Behavior normal.        Thought Content: Thought content normal.  Judgment: Judgment normal.          Assessment & Plan:  Well exam. We discussed diet and exercise. For the hypogonadism, he will try testosterone injections weekly. We will recheck a level in 12 weeks.  Alysia Penna, MD

## 2021-10-08 NOTE — Assessment & Plan Note (Signed)
There is some history of anxiety and of ethanol use which might impact ability to initiate and maintain sleep.  We can reassess if this remains a problem after sleep study.

## 2021-10-08 NOTE — Assessment & Plan Note (Signed)
History of snoring and positive family history of OSA suggested OSA is the medical concern to be ruled out.  Appropriate discussion of sleep habits, safe driving, medical issues of OSA, testing and treatment options were reviewed. Plan-sleep study

## 2021-10-14 ENCOUNTER — Encounter: Payer: Self-pay | Admitting: Family Medicine

## 2021-10-14 NOTE — Telephone Encounter (Signed)
Actually I think he would be better served to talk to [place]. He can call [phone] to make an appt with a therapist. No referral is needed.

## 2021-10-24 ENCOUNTER — Other Ambulatory Visit: Payer: Self-pay | Admitting: Family Medicine

## 2021-10-27 NOTE — Telephone Encounter (Signed)
Last OV physical- 10/08/2021  Medication has been discontinued off med list.  Can patient receive a refill?

## 2021-11-04 ENCOUNTER — Telehealth: Payer: Self-pay | Admitting: Internal Medicine

## 2021-11-10 ENCOUNTER — Ambulatory Visit (INDEPENDENT_AMBULATORY_CARE_PROVIDER_SITE_OTHER): Payer: BC Managed Care – PPO | Admitting: Psychologist

## 2021-11-10 ENCOUNTER — Other Ambulatory Visit: Payer: Self-pay

## 2021-11-10 DIAGNOSIS — F411 Generalized anxiety disorder: Secondary | ICD-10-CM | POA: Diagnosis not present

## 2021-11-10 NOTE — Progress Notes (Signed)
                Cyerra Yim, PsyD 

## 2021-11-10 NOTE — Plan of Care (Signed)

## 2021-11-10 NOTE — Progress Notes (Signed)
[facility] Counselor Initial Adult Exam ? ?Name: Christopher [name] ?Date: 11/10/2021 ?MRN: [id number] ?DOB: Jan 06, 1956 ?PCP: Laurey Morale, MD ? ?Time spent: 8:05 am to 8:40 am; total time: 35 minutes ? ?This session was held via in person. The patient consented to in-person therapy and was in the clinician's office. Limits of confidentiality were discussed with the patient ? ?Guardian/Payee:  NA   ? ?Paperwork requested: No  ? ?Reason for Visit /Presenting Problem: Anxiety secondary to thinking about life after retirement and lack of meaning and purpose ? ?Mental Status Exam: ?Appearance:   Well Groomed     ?Behavior:  Appropriate  ?Motor:  Normal  ?Speech/Language:   Clear and Coherent  ?Affect:  Appropriate  ?Mood:  normal  ?Thought process:  normal  ?Thought content:    WNL  ?Sensory/Perceptual disturbances:    WNL  ?Orientation:  oriented to person, place, and time/date  ?Attention:  Good  ?Concentration:  Good  ?Memory:  WNL  ?Fund of knowledge:   Good  ?Insight:    Fair  ?Judgment:   Fair  ?Impulse Control:  Good  ? ? ?Reported Symptoms:  The patient endorsed experiencing the following: feeling restless, feeling on edge, feeling overwhelmed by different worries, and difficulty controlling worries. He denied suicidal and homicidal ideation.  ? ?Risk Assessment: ?Danger to Self:  No ?Self-injurious Behavior: No ?Danger to Others: No ?Duty to Warn:no ?Physical Aggression / Violence:No  ?Access to Firearms a concern: No  ?Gang Involvement:No  ?Patient / guardian was educated about steps to take if suicide or homicide risk level increases between visits: n/a ?While future psychiatric events cannot be accurately predicted, the patient does not currently require acute inpatient psychiatric care and does not currently meet [facility] involuntary commitment criteria. ? ?Substance Abuse History: ?Current substance abuse:  Patient described himself as an alcoholic and stated that he wants to cut back on  the amount of alcohol he is consuming.     ? ?Past Psychiatric History:   ?Previous psychological history is significant for anxiety ?Outpatient Providers:NA ?History of Psych Hospitalization: No  ?Psychological Testing:  NA   ? ?Abuse History:  ?Victim of: No.,  NA    ?Report needed: No. ?Victim of Neglect:No. ?Perpetrator of  NA   ?Witness / Exposure to Domestic Violence: No   ?Protective Services Involvement: No  ?Witness to [employer] Violence:  No  ? ?Family History:  ?Family History  ?Problem Relation Age of Onset  ? Hypertension Father   ? Hypertension Brother   ? Gallbladder disease Brother   ? Colon polyps Maternal Uncle   ?     familal polyposis  ? Colon cancer Maternal Aunt   ? Other Neg Hx   ?     hypogonadism  ? Stomach cancer Neg Hx   ? Esophageal cancer Neg Hx   ? Pancreatic cancer Neg Hx   ? Liver disease Neg Hx   ? ? ?Living situation: the patient lives with their family ? ?Sexual Orientation: Straight ? ?Relationship Status: married  ?Name of spouse / other:Beth ?If a parent, number of children / ages:Patient has a 49 year old son and a 76 year old son.  ? ?Support Systems: spouse ?friends ? ?Financial Stress:  No  ? ?Income/Employment/Disability: Employment. Teaches at [employer] ? ?Military Service: No  ? ?Educational History: ?Education: post Forensic psychologist work or degree ? ?Religion/Sprituality/World View: ?Agnostic ? ?Any cultural differences that may affect / interfere with treatment:  not  applicable  ? ?Recreation/Hobbies: Woodworking, and doing activities with his hands ? ?Stressors: Other: Making decisions about next phase of life   ? ?Strengths: Supportive Relationships ? ?Barriers:  NA  ? ?Legal History: ?Pending legal issue / charges: The patient has no significant history of legal issues. ?History of legal issue / charges:  NA ? ?Medical History/Surgical History: reviewed ?Past Medical History:  ?Diagnosis Date  ? Arthritis   ? Colon polyps 09/26/2012  ? TUBULAR  ADENOMA (X1)  ? Depression   ? Hypertension   ? Insomnia   ? ? ?Past Surgical History:  ?Procedure Laterality Date  ? COLONOSCOPY  09-26-12  ? per Dr. Henrene Pastor, adenomatous polyps, repeat in 5 yrs   ? COLONOSCOPY  01/17/2018  ? per Dr. Henrene Pastor, adenomatous polyps, repeat in 5 yrs   ? EYE SURGERY    ? for glaucoma  ? KNEE ARTHROPLASTY Left 03/01/2019  ? Procedure: COMPUTER ASSISTED TOTAL KNEE ARTHROPLASTY;  Surgeon: Rod Can, MD;  Location: [facility];  Service: Orthopedics;  Laterality: Left;  ? REPLACEMENT TOTAL KNEE    ? right  ? ROTATOR CUFF REPAIR    ? right  ? ROTATOR CUFF REPAIR    ? left  ? TOE SURGERY  08/2018  ? Dr Doran Durand   ? WISDOM TOOTH EXTRACTION    ? ? ?Medications: ?Current Outpatient Medications  ?Medication Sig Dispense Refill  ? LORazepam (ATIVAN) 0.5 MG tablet TAKE ONE TABLET BY MOUTH TWICE A DAY AS NEEDED 60 tablet 5  ? Omega-3 1000 MG CAPS Take 1,000 mg by mouth daily.    ? Psyllium 100 % POWD Take by mouth as directed.    ? sildenafil (VIAGRA) 100 MG tablet TAKE ONE TABLET BY MOUTH DAILY AS NEEDED FOR FOR ERECTILE DYSFUNCTION 10 tablet 11  ? SYRINGE-NEEDLE, DISP, 3 ML (LUER LOCK SAFETY SYRINGES) 22G X 1-1/2" 3 ML MISC 1 application by Does not apply route every 7 (seven) days. 50 each 2  ? temazepam (RESTORIL) 30 MG capsule TAKE ONE CAPSULE BY MOUTH AT BEDTIME AS NEEDED 30 capsule 5  ? testosterone cypionate (DEPOTESTOSTERONE CYPIONATE) 200 MG/ML injection Inject 1 mL (200 mg total) into the muscle every 7 (seven) days. 12 mL 1  ? Turmeric 500 MG CAPS Take 500 mg by mouth daily.    ? ?No current facility-administered medications for this visit.  ? ? ?No Known Allergies ? ?Diagnoses:  ?F41.1 generalized anxiety disorder ? ?Plan of Care: The patient is a 66 year old Caucasian male who was referred due to experiencing some anxiety secondary to contemplating retirement and exploring purpose/meaning/values of the next phase of life. The patient lives at home with his wife, son, dog, two cats, and  chickens. The patient meets criteria for a diagnosis of F41.1 generalized anxiety disorder based off of the following: feeling restless, feeling on edge, feeling overwhelmed by different worries, and difficulty controlling worries. He denied suicidal and homicidal ideation.  ? ?The patient stated that he wanted to discuss/process next phase of life and having purpose and meaning. ? ?This psychologist makes the recommendation that the patient participate in therapy at least once a month to assist in meeting his needs.  ? ? ?Conception Chancy, PsyD  ? ? ? ?

## 2021-11-27 ENCOUNTER — Ambulatory Visit: Payer: BC Managed Care – PPO

## 2021-11-27 ENCOUNTER — Other Ambulatory Visit: Payer: Self-pay

## 2021-11-27 DIAGNOSIS — G4733 Obstructive sleep apnea (adult) (pediatric): Secondary | ICD-10-CM

## 2021-11-27 DIAGNOSIS — R0683 Snoring: Secondary | ICD-10-CM

## 2021-11-28 ENCOUNTER — Ambulatory Visit (INDEPENDENT_AMBULATORY_CARE_PROVIDER_SITE_OTHER): Payer: BC Managed Care – PPO | Admitting: Psychologist

## 2021-11-28 DIAGNOSIS — F411 Generalized anxiety disorder: Secondary | ICD-10-CM | POA: Diagnosis not present

## 2021-11-28 NOTE — Progress Notes (Signed)
[facility] Counselor/Therapist Progress Note ? ?Patient ID: Christopher [name], MRN: [id number],   ? ?Date: 11/28/2021 ? ?Time Spent: 8:06 am to 8:44 am; total time: 38 minutes  ? ?This session was held via in person. The patient consented to in-person therapy and was in the clinician's office. Limits of confidentiality were discussed with the patient.  ? ?Treatment Type: Individual Therapy ? ?Reported Symptoms: Anxiety ? ?Mental Status Exam: ?Appearance:  Well Groomed     ?Behavior: Appropriate  ?Motor: Normal  ?Speech/Language:  Clear and Coherent  ?Affect: Appropriate  ?Mood: normal  ?Thought process: normal  ?Thought content:   WNL  ?Sensory/Perceptual disturbances:   WNL  ?Orientation: oriented to person, place, and time/date  ?Attention: Good  ?Concentration: Good  ?Memory: WNL  ?Fund of knowledge:  Good  ?Insight:   Good  ?Judgment:  Good  ?Impulse Control: Good  ? ?Risk Assessment: ?Danger to Self:  No ?Self-injurious Behavior: No ?Danger to Others: No ?Duty to Warn:no ?Physical Aggression / Violence:No  ?Access to Firearms a concern: No  ?Gang Involvement:No  ? ?Subjective: Beginning the session, the patient described himself as doing better and elaborating, indicating that he has made some decisions related to retiring and what that looks like for him. After reviewing the treatment plan, patient spent time reflecting on the concepts of purpose, and his values guiding behaviors. From there, the themes of the unknown and lack of control may be contributing to some distress. Patient voiced interest in the book about the Practice of [facility]. He was agreeable to following up. He denied suicidal and homicidal ideation.   ? ?Interventions:  Worked on developing a therapeutic relationship with the patient using active listening and reflective statements. Provided emotional support using empathy and validation. Reviewed the treatment plan. Reviewed events since the intake. Praised patient for doing  better and explored what had assisted the patient. Used summary statements. Reviewed completed homework assignment. Used socratic questions to assist the patient gain insight into self. Explored the different values that are important to the patient. Assisted a partial life review related to work. Identified the themes of the unknown and control. Processed thoughts and emotions related to those concepts. Provided psychoeducation about the Practice of [facility]. Provided empathic statements. Assessed for suicidal and homicidal ideation.  ? ?Homework: Read the [facility] of [facility] ? ?Next Session: Review homework. Process sitting in the unknown and letting go of control. Emotional support ? ?Diagnosis: F41.1 generalized anxiety disorder ? ?Plan:  ? ?Client Abilities: Friendly and easy to develop rapport ? ?Client Preferences: ACT ? ?Client statement of Needs: Process next steps of life ? ?Treatment Level: Outpatient  ? ?Goals ?Alleviate depressive symptoms ?Recognize, accept, and cope with depressive feelings ?Develop healthy thinking patterns ?Develop healthy interpersonal relationships ?Reduce overall frequency, intensity, and duration of anxiety ?Stabilize anxiety level wile increasing ability to function ?Enhance ability to effectively cope with full variety of stressors ?Learn and implement coping skills that result in a reduction of anxiety  ? ?Objectives target date for all objectives is 11/11/2022 ?Verbalize an understanding of the cognitive, physiological, and behavioral components of anxiety ?Learning and implement calming skills to reduce overall anxiety ?Verbalize an understanding of the role that cognitive biases play in excessive irrational worry and persistent anxiety symptoms ?Identify, challenge, and replace based fearful talk ?Learn and implement problem solving strategies ?Identify and engage in pleasant activities ?Learning and implement personal and interpersonal skills to reduce anxiety and  improve interpersonal relationships ?Learn to accept limitations in  life and commit to tolerating, rather than avoiding, unpleasant emotions while accomplishing meaningful goals ?Identify major life conflicts from the past and present that form the basis for present anxiety ?Maintain involvement in work, family, and social activities ?Reestablish a consistent sleep-wake cycle ?Cooperate with a medical evaluation  ?Cooperate with a medication evaluation by a physician ?Verbalize an accurate understanding of depression ?Verbalize an understanding of the treatment ?Identify and replace thoughts that support depression ?Learn and implement behavioral strategies ?Verbalize an understanding and resolution of current interpersonal problems ?Learn and implement problem solving and decision making skills ?Learn and implement conflict resolution skills to resolve interpersonal problems ?Verbalize an understanding of healthy and unhealthy emotions verbalize insight into how past relationships may be influence current experiences with depression ?Use mindfulness and acceptance strategies and increase value based behavior  ?Increase hopeful statements about the future.  ?Interventions ?Engage the patient in behavioral activation ?Use instruction, modeling, and role-playing to build the client's general social, communication, and/or conflict resolution skills ?Use Acceptance and Commitment Therapy to help client accept uncomfortable realities in order to accomplish value-consistent goals ?Reinforce the client's insight into the role of his/her past emotional pain and present anxiety  ?Support the client in following through with work, family, and social activities ?Teach and implement sleep hygiene practices  ?Refer the patient to a physician for a psychotropic medication consultation ?Monito the clint's psychotropic medication compliance ?Discuss how anxiety typically involves excessive worry, various bodily expressions of  tension, and avoidance of what is threatening that interact to maintain the problem  ?Teach the patient relaxation skills ?Assign the patient homework ?Discuss examples demonstrating that unrealistic worry overestimates the probability of threats and underestimates patient's ability  ?Assist the patient in analyzing his or her worries ?Help patient understand that avoidance is reinforcing  ?Consistent with treatment model, discuss how change in cognitive, behavioral, and interpersonal can help client alleviate depression ?CBT ?Behavioral activation help the client explore the relationship, nature of the dispute,  ?Help the client develop new interpersonal skills and relationships ?Conduct Problem solving therapy ?Teach conflict resolution skills ?Use a process-experiential approach ?Conduct TLDP ?Conduct ACT ?Evaluate need for psychotropic medication ?Monitor adherence to medication  ? ?The patient and clinician reviewed the treatment plan on 11/28/2021. The patient approved of the treatment plan.  ? ?Conception Chancy, PsyD ? ? ? ?

## 2021-11-28 NOTE — Progress Notes (Signed)
                Azzam Mehra, PsyD 

## 2021-12-03 ENCOUNTER — Encounter: Payer: Self-pay | Admitting: Internal Medicine

## 2021-12-03 DIAGNOSIS — G4733 Obstructive sleep apnea (adult) (pediatric): Secondary | ICD-10-CM

## 2021-12-03 NOTE — Telephone Encounter (Signed)
The sleep study showed severe obstructive sleep apnea, averaging 32 apneas/ hour, with drops in blood oxygen level.   ?I recommend we order neww DME, new CPAP auto 5-20, mask of choice, humidifier, supplies, AirView/ card ? ?He will need a return ov appointment in 31-90 days. ?

## 2021-12-03 NOTE — Telephone Encounter (Signed)
Pt requesting to know the results of recent sleep study. Dr. Annamaria Boots, please advise. ?

## 2021-12-17 NOTE — Telephone Encounter (Signed)
Will close encounter since there is no documentation in encounter.  ?

## 2021-12-24 ENCOUNTER — Ambulatory Visit (INDEPENDENT_AMBULATORY_CARE_PROVIDER_SITE_OTHER): Payer: BC Managed Care – PPO | Admitting: Psychologist

## 2021-12-24 DIAGNOSIS — F411 Generalized anxiety disorder: Secondary | ICD-10-CM

## 2021-12-24 NOTE — Progress Notes (Signed)
[facility] Counselor/Therapist Progress Note ? ?Patient ID: Christopher [name], MRN: [id number],   ? ?Date: 12/24/2021 ? ?Time Spent: 8:07 am to 8:48 am; total time: 41 minutes ? ?This session was held via in person. The patient consented to in-person therapy and was in the clinician's office. Limits of confidentiality were discussed with the patient.  ? ?Treatment Type: Individual Therapy ? ?Reported Symptoms: Anxiety ? ?Mental Status Exam: ?Appearance:  Well Groomed     ?Behavior: Appropriate  ?Motor: Normal  ?Speech/Language:  Clear and Coherent  ?Affect: Appropriate  ?Mood: normal  ?Thought process: normal  ?Thought content:   WNL  ?Sensory/Perceptual disturbances:   WNL  ?Orientation: oriented to person, place, and time/date  ?Attention: Good  ?Concentration: Good  ?Memory: WNL  ?Fund of knowledge:  Good  ?Insight:   Good  ?Judgment:  Good  ?Impulse Control: Good  ? ?Risk Assessment: ?Danger to Self:  No ?Self-injurious Behavior: No ?Danger to Others: No ?Duty to Warn:no ?Physical Aggression / Violence:No  ?Access to Firearms a concern: No  ?Gang Involvement:No  ? ?Subjective: Beginning the session, the patient described himself as doing well and indicated that he had started reading the book. He then spent time reflecting on what he will do when he retires. He reiterated interest in woodworking and ongoing education. He explored what this would look like to him. He then ended the session by voicing concern over his alcohol use and wanting to reduce it. He processed thoughts and emotions. He was agreeable to following up. He denied suicidal and homicidal ideation.   ? ?Interventions:  Worked on developing a therapeutic relationship with the patient using active listening and reflective statements. Provided emotional support using empathy and validation. Used summary statements. Praised patient for doing well and explored what was assisting the patient. Reflected on events since the last session. Used  socratic questions to assist the patient gain insight into self. Explored the concept of woodworking and ongoing education. Processed what that would look like to the patient. Assisted in problem solving. Provided psychoeducation related to antabuse. Briefly explored rationale behind alcohol use. Worked on discerning habitual response versus stressors. Briefly explored the idea of coping strategies and identifying alternative habits. Discussed next steps. Provided empathic statements. Assessed for suicidal and homicidal ideation.  ? ?Homework: Read the book Practice of groundedness, reflect on woodworking and ongoing education ? ?Next Session: Review homework, coping strategies, and emotional support ? ?Diagnosis: F41.1 generalized anxiety disorder ? ?Plan:  ? ?Client Abilities: Friendly and easy to develop rapport ? ?Client Preferences: ACT ? ?Client statement of Needs: Process next steps of life ? ?Treatment Level: Outpatient  ? ?Goals ?Alleviate depressive symptoms ?Recognize, accept, and cope with depressive feelings ?Develop healthy thinking patterns ?Develop healthy interpersonal relationships ?Reduce overall frequency, intensity, and duration of anxiety ?Stabilize anxiety level wile increasing ability to function ?Enhance ability to effectively cope with full variety of stressors ?Learn and implement coping skills that result in a reduction of anxiety  ? ?Objectives target date for all objectives is 11/11/2022 ?Verbalize an understanding of the cognitive, physiological, and behavioral components of anxiety ?Learning and implement calming skills to reduce overall anxiety ?Verbalize an understanding of the role that cognitive biases play in excessive irrational worry and persistent anxiety symptoms ?Identify, challenge, and replace based fearful talk ?Learn and implement problem solving strategies ?Identify and engage in pleasant activities ?Learning and implement personal and interpersonal skills to reduce  anxiety and improve interpersonal relationships ?Learn to accept limitations in  life and commit to tolerating, rather than avoiding, unpleasant emotions while accomplishing meaningful goals ?Identify major life conflicts from the past and present that form the basis for present anxiety ?Maintain involvement in work, family, and social activities ?Reestablish a consistent sleep-wake cycle ?Cooperate with a medical evaluation  ?Cooperate with a medication evaluation by a physician ?Verbalize an accurate understanding of depression ?Verbalize an understanding of the treatment ?Identify and replace thoughts that support depression ?Learn and implement behavioral strategies ?Verbalize an understanding and resolution of current interpersonal problems ?Learn and implement problem solving and decision making skills ?Learn and implement conflict resolution skills to resolve interpersonal problems ?Verbalize an understanding of healthy and unhealthy emotions verbalize insight into how past relationships may be influence current experiences with depression ?Use mindfulness and acceptance strategies and increase value based behavior  ?Increase hopeful statements about the future.  ?Interventions ?Engage the patient in behavioral activation ?Use instruction, modeling, and role-playing to build the client's general social, communication, and/or conflict resolution skills ?Use Acceptance and Commitment Therapy to help client accept uncomfortable realities in order to accomplish value-consistent goals ?Reinforce the client's insight into the role of his/her past emotional pain and present anxiety  ?Support the client in following through with work, family, and social activities ?Teach and implement sleep hygiene practices  ?Refer the patient to a physician for a psychotropic medication consultation ?Monito the clint's psychotropic medication compliance ?Discuss how anxiety typically involves excessive worry, various bodily  expressions of tension, and avoidance of what is threatening that interact to maintain the problem  ?Teach the patient relaxation skills ?Assign the patient homework ?Discuss examples demonstrating that unrealistic worry overestimates the probability of threats and underestimates patient's ability  ?Assist the patient in analyzing his or her worries ?Help patient understand that avoidance is reinforcing  ?Consistent with treatment model, discuss how change in cognitive, behavioral, and interpersonal can help client alleviate depression ?CBT ?Behavioral activation help the client explore the relationship, nature of the dispute,  ?Help the client develop new interpersonal skills and relationships ?Conduct Problem solving therapy ?Teach conflict resolution skills ?Use a process-experiential approach ?Conduct TLDP ?Conduct ACT ?Evaluate need for psychotropic medication ?Monitor adherence to medication  ? ?The patient and clinician reviewed the treatment plan on 11/28/2021. The patient approved of the treatment plan.  ? ?Conception Chancy, PsyD ?

## 2021-12-30 ENCOUNTER — Encounter: Payer: Self-pay | Admitting: Family Medicine

## 2021-12-30 ENCOUNTER — Ambulatory Visit: Payer: BC Managed Care – PPO | Admitting: Family Medicine

## 2021-12-30 VITALS — BP 128/80 | HR 59 | Temp 97.8°F | Wt 186.0 lb

## 2021-12-30 DIAGNOSIS — E291 Testicular hypofunction: Secondary | ICD-10-CM | POA: Diagnosis not present

## 2021-12-30 LAB — PSA: PSA: 1.24 ng/mL (ref 0.10–4.00)

## 2021-12-30 LAB — TESTOSTERONE: Testosterone: 1210.72 ng/dL — ABNORMAL HIGH (ref 300.00–890.00)

## 2021-12-30 NOTE — Progress Notes (Signed)
? ?  Subjective:  ? ? Patient ID: Christopher [name], male    DOB: Jan 22, 1956, 66 y.o.   MRN: [id number] ? ?HPI ?Here to follow up on hypogonadism. He was here for a well exam on 10-02-21 and his testosterone level was low at 292. He was started on testosterone injections every week. Since then he feels better, his libido has improved, and he feels stronger.  ? ? ?Review of Systems  ?Constitutional: Negative.   ?Respiratory: Negative.    ?Cardiovascular: Negative.   ? ?   ?Objective:  ? Physical Exam ?Constitutional:   ?   Appearance: Normal appearance.  ?Cardiovascular:  ?   Rate and Rhythm: Normal rate and regular rhythm.  ?   Pulses: Normal pulses.  ?   Heart sounds: Normal heart sounds.  ?Pulmonary:  ?   Effort: Pulmonary effort is normal.  ?   Breath sounds: Normal breath sounds.  ?Neurological:  ?   Mental Status: He is alert.  ? ? ? ? ? ?   ?Assessment & Plan:  ?Hypogonadism. We will check another testosterone level today. ?Alysia Penna, MD ? ? ?

## 2021-12-31 ENCOUNTER — Encounter: Payer: Self-pay | Admitting: Family Medicine

## 2021-12-31 ENCOUNTER — Ambulatory Visit: Payer: BC Managed Care – PPO | Admitting: Family Medicine

## 2022-01-01 NOTE — Telephone Encounter (Signed)
Yes we will cut the dosing back to every 14 days. He still has a refill available ?

## 2022-01-28 ENCOUNTER — Ambulatory Visit: Payer: BC Managed Care – PPO | Admitting: Psychologist

## 2022-01-30 ENCOUNTER — Telehealth: Payer: Self-pay | Admitting: Internal Medicine

## 2022-01-30 NOTE — Telephone Encounter (Signed)
ATC patient to let him know that I was going to send message to Dr. Annamaria Boots to see if he is ok with making OV a mychart video visit, per DPR left detailed message. Will call back once we get his approval  Dr. Annamaria Boots please advise

## 2022-02-03 ENCOUNTER — Ambulatory Visit: Payer: BC Managed Care – PPO | Admitting: Internal Medicine

## 2022-02-03 NOTE — Telephone Encounter (Signed)
Ok video visit

## 2022-02-05 NOTE — Telephone Encounter (Signed)
Called and spoke with patient to let him know that Dr. Annamaria Boots is ok with switching to video visit. He states that he has changed his mind and would like to come in now. Advised him that's fine and if he changes his mind to call and let us know and it can be switched. Nothing further needed at this time.

## 2022-03-05 ENCOUNTER — Ambulatory Visit: Payer: BC Managed Care – PPO | Admitting: Internal Medicine

## 2022-03-09 ENCOUNTER — Ambulatory Visit: Payer: BC Managed Care – PPO | Admitting: Internal Medicine

## 2022-03-24 ENCOUNTER — Encounter: Payer: Self-pay | Admitting: Internal Medicine

## 2022-03-25 NOTE — Progress Notes (Signed)
10/06/21- 65 yoM never smoker for sleep evaluation courtesy of Dr Sarajane Jews with concern of OSA Medical problem list includes HTN, Hypogonadism, ETOH, Anemia, Anxiety, Glaucoma, Insomnia,  Meds include temazepam 30,  Epworth score-2 Body weight today- 175 lbs Covid vax- 2 Moderna Flu vax-had He describes episodes once or twice a year when he wakes up and feels he cannot inhale-very alarming but transient.  Aware of snoring and occasional gasping. Uses occasional Ativan for sleep.  Cut morning coffee. Brother has OSA. Patient denies lung or heart problems.  ENT surgery limited to septoplasty. Teaches biology at [employer],  03/26/22- 65 yoM never smoker followed for OSA, complicated by HTN, Hypogonadism, ETOH, Anemia, Anxiety, Glaucoma, Insomnia,  HST 11/27/21- AHI 32.7/ hr, desaturation to 74%, body weight 175 lbs CPAP auto 5-20/ [facility]- ordered 12/03/21     AirSense 10 AutoSet Download compliance-90%, AHI 2.7/ hr Body weight today-184 lbs ------Pt f/u, pt doing well with nasal pillows. Apprx. 7-8hrs of sleep a night.  Download reviewed.  Good start on CPAP.  ROS-see HPI   + = positive Constitutional:    weight loss, night sweats, fevers, chills, fatigue, lassitude. HEENT:    headaches, difficulty swallowing, tooth/dental problems, sore throat,       sneezing, itching, ear ache, nasal congestion, post nasal drip, snoring CV:    chest pain, orthopnea, PND, swelling in lower extremities, anasarca,                                  dizziness, palpitations Resp:   shortness of breath with exertion or at rest.                productive cough,   non-productive cough, coughing up of blood.              change in color of mucus.  wheezing.   Skin:    rash or lesions. GI:  No-   heartburn, indigestion, abdominal pain, nausea, vomiting, diarrhea,                 change in bowel habits, loss of appetite GU: dysuria, change in color of urine, no urgency or frequency.   flank pain. MS:    joint pain, stiffness, decreased range of motion, back pain. Neuro-     nothing unusual Psych:  change in mood or affect.  depression or anxiety.   memory loss.  OBJ- Physical Exam General- Alert, Oriented, Affect-appropriate, Distress- none acute, + trim Skin- rash-none, lesions- none, excoriation- none Lymphadenopathy- none Head- atraumatic            Eyes- Gross vision intact, PERRLA, conjunctivae and secretions clear            Ears- Hearing, canals-normal            Nose- Clear, no-Septal dev, mucus, polyps, erosion, perforation             Throat- Mallampati III , mucosa clear , drainage- none, tonsils- atrophic, + teeth Neck- flexible , trachea midline, no stridor , thyroid nl, carotid no bruit Chest - symmetrical excursion , unlabored           Heart/CV- RRR , no murmur , no gallop  , no rub, nl s1 s2                           - JVD- none , edema- none,  stasis changes- none, varices- none           Lung- clear to P&A, wheeze- none, cough- none , dullness-none, rub- none           Chest wall-  Abd-  Br/ Gen/ Rectal- Not done, not indicated Extrem- cyanosis- none, clubbing, none, atrophy- none, strength- nl Neuro- grossly intact to observation

## 2022-03-26 ENCOUNTER — Encounter: Payer: Self-pay | Admitting: Internal Medicine

## 2022-03-26 ENCOUNTER — Ambulatory Visit (INDEPENDENT_AMBULATORY_CARE_PROVIDER_SITE_OTHER): Payer: BC Managed Care – PPO | Admitting: Internal Medicine

## 2022-03-26 DIAGNOSIS — F5101 Primary insomnia: Secondary | ICD-10-CM

## 2022-03-26 DIAGNOSIS — G4733 Obstructive sleep apnea (adult) (pediatric): Secondary | ICD-10-CM

## 2022-03-26 NOTE — Patient Instructions (Signed)
We can continue CPAP auto 5-20  Please call if we can help 

## 2022-04-20 ENCOUNTER — Other Ambulatory Visit: Payer: Self-pay | Admitting: Family Medicine

## 2022-04-20 NOTE — Telephone Encounter (Signed)
Last OV- 12/30/21 Last refill- 09/10/21---30 capsules, 5 refills  No future OV scheduled.

## 2022-05-08 NOTE — Assessment & Plan Note (Signed)
Reviewed sleep habits. Plan-continue temazepam if helpful

## 2022-05-08 NOTE — Assessment & Plan Note (Signed)
Benefits from CPAP with good compliance and control Plan- continue auto 5-20 

## 2022-05-29 ENCOUNTER — Other Ambulatory Visit: Payer: Self-pay | Admitting: Family Medicine

## 2022-06-02 NOTE — Telephone Encounter (Signed)
Last OV-12/30/21 Last refill-10/27/21--60 tabs, 5 refills  No future OV scheduled.

## 2022-06-08 ENCOUNTER — Encounter: Payer: Self-pay | Admitting: Family Medicine

## 2022-06-09 MED ORDER — METHYLPREDNISOLONE 4 MG PO TBPK: ORAL_TABLET | ORAL | 0 refills | Status: DC

## 2022-06-09 NOTE — Telephone Encounter (Signed)
I sent in a Medrol dose pack. If this does not help, have him see me OV

## 2022-06-11 ENCOUNTER — Other Ambulatory Visit: Payer: Self-pay | Admitting: Family Medicine

## 2022-06-11 NOTE — Telephone Encounter (Signed)
Last OV-12/30/21 Last refill-10-08-21  No future OV scheduled.

## 2022-06-22 ENCOUNTER — Encounter: Payer: Self-pay | Admitting: Family Medicine

## 2022-06-23 ENCOUNTER — Ambulatory Visit (INDEPENDENT_AMBULATORY_CARE_PROVIDER_SITE_OTHER): Payer: BC Managed Care – PPO | Admitting: Family Medicine

## 2022-06-23 ENCOUNTER — Encounter: Payer: Self-pay | Admitting: Family Medicine

## 2022-06-23 VITALS — BP 126/74 | HR 70 | Temp 98.0°F | Wt 176.0 lb

## 2022-06-23 DIAGNOSIS — F418 Other specified anxiety disorders: Secondary | ICD-10-CM | POA: Diagnosis not present

## 2022-06-23 DIAGNOSIS — H9312 Tinnitus, left ear: Secondary | ICD-10-CM | POA: Diagnosis not present

## 2022-06-23 MED ORDER — SERTRALINE HCL 50 MG PO TABS: 50.0000 mg | ORAL_TABLET | Freq: Every day | ORAL | 3 refills | Status: DC

## 2022-06-23 NOTE — Progress Notes (Signed)
   Subjective:    Patient ID: Christopher [name], male    DOB: 1956/06/02, 66 y.o.   MRN: [id number]  HPI Here for 2 issues. First he began noticing a ringing or buzzing in th eleft ear about 6 months ago. No hearing loss or pain or dizziness. He wears hearing protection now but did not when he was younger. Also he wants to try something for his depression again. He describes feeling blue or sad most of the time. He says things are not as fun or enjoyable as they used to be. He took Wellbutrin for about 5 years but he says it never really helped much.    Review of Systems  Constitutional: Negative.   HENT:  Positive for tinnitus. Negative for ear pain, hearing loss and sinus pain.   Respiratory: Negative.    Cardiovascular: Negative.   Neurological:  Negative for dizziness and light-headedness.  Psychiatric/Behavioral:  Positive for dysphoric mood and sleep disturbance. Negative for agitation, behavioral problems, confusion, decreased concentration, hallucinations, self-injury and suicidal ideas. The patient is nervous/anxious.        Objective:   Physical Exam Constitutional:      Appearance: Normal appearance.  HENT:     Right Ear: Tympanic membrane, ear canal and external ear normal.     Left Ear: Tympanic membrane, ear canal and external ear normal.  Cardiovascular:     Rate and Rhythm: Normal rate and regular rhythm.     Pulses: Normal pulses.     Heart sounds: Normal heart sounds.  Pulmonary:     Effort: Pulmonary effort is normal.     Breath sounds: Normal breath sounds.  Neurological:     General: No focal deficit present.     Mental Status: He is alert and oriented to person, place, and time.  Psychiatric:        Mood and Affect: Mood normal.        Behavior: Behavior normal.        Thought Content: Thought content normal.           Assessment & Plan:  For the tinnitus, I advised him to reduce his intake of caffeine, alcohol, and NSAIDs. Refer to ENT. For the  depression, he will try Zoloft 50 mg daily. Follow up in 3-4 weeks.  Alysia Penna, MD

## 2022-09-11 ENCOUNTER — Encounter: Payer: Self-pay | Admitting: Family Medicine

## 2022-09-11 MED ORDER — MIRTAZAPINE 30 MG PO TABS: 30.0000 mg | ORAL_TABLET | Freq: Every day | ORAL | 2 refills | Status: DC

## 2022-09-11 NOTE — Telephone Encounter (Signed)
Spoke with pt aware that new Rx was sent to his pharmacy. Verbalized understanding

## 2022-09-11 NOTE — Telephone Encounter (Signed)
Let him try Mirtazapine. This is different from all the others he has tried. I sent in a RX for him

## 2022-10-10 ENCOUNTER — Other Ambulatory Visit: Payer: Self-pay | Admitting: Family Medicine

## 2022-10-13 ENCOUNTER — Encounter: Payer: Self-pay | Admitting: Family Medicine

## 2022-10-13 ENCOUNTER — Ambulatory Visit: Payer: Medicare PPO | Admitting: Family Medicine

## 2022-10-13 VITALS — BP 142/80 | HR 56 | Temp 98.0°F | Ht 71.0 in | Wt 178.0 lb

## 2022-10-13 DIAGNOSIS — E291 Testicular hypofunction: Secondary | ICD-10-CM | POA: Diagnosis not present

## 2022-10-13 DIAGNOSIS — F5101 Primary insomnia: Secondary | ICD-10-CM

## 2022-10-13 DIAGNOSIS — H409 Unspecified glaucoma: Secondary | ICD-10-CM

## 2022-10-13 DIAGNOSIS — Z111 Encounter for screening for respiratory tuberculosis: Secondary | ICD-10-CM | POA: Diagnosis not present

## 2022-10-13 DIAGNOSIS — H9313 Tinnitus, bilateral: Secondary | ICD-10-CM | POA: Insufficient documentation

## 2022-10-13 DIAGNOSIS — G4733 Obstructive sleep apnea (adult) (pediatric): Secondary | ICD-10-CM

## 2022-10-13 DIAGNOSIS — N138 Other obstructive and reflux uropathy: Secondary | ICD-10-CM

## 2022-10-13 DIAGNOSIS — R739 Hyperglycemia, unspecified: Secondary | ICD-10-CM | POA: Diagnosis not present

## 2022-10-13 DIAGNOSIS — N529 Male erectile dysfunction, unspecified: Secondary | ICD-10-CM

## 2022-10-13 DIAGNOSIS — F418 Other specified anxiety disorders: Secondary | ICD-10-CM | POA: Diagnosis not present

## 2022-10-13 DIAGNOSIS — N401 Enlarged prostate with lower urinary tract symptoms: Secondary | ICD-10-CM

## 2022-10-13 DIAGNOSIS — H903 Sensorineural hearing loss, bilateral: Secondary | ICD-10-CM | POA: Insufficient documentation

## 2022-10-13 DIAGNOSIS — I10 Essential (primary) hypertension: Secondary | ICD-10-CM | POA: Diagnosis not present

## 2022-10-13 DIAGNOSIS — M1712 Unilateral primary osteoarthritis, left knee: Secondary | ICD-10-CM

## 2022-10-13 LAB — CBC WITH DIFFERENTIAL/PLATELET
Basophils Absolute: 0 10*3/uL (ref 0.0–0.1)
Basophils Relative: 0.6 % (ref 0.0–3.0)
Eosinophils Absolute: 0.1 10*3/uL (ref 0.0–0.7)
Eosinophils Relative: 1.5 % (ref 0.0–5.0)
HCT: 46.1 % (ref 39.0–52.0)
Hemoglobin: 15.7 g/dL (ref 13.0–17.0)
Lymphocytes Relative: 30.3 % (ref 12.0–46.0)
Lymphs Abs: 1.8 10*3/uL (ref 0.7–4.0)
MCHC: 34.1 g/dL (ref 30.0–36.0)
MCV: 96.3 fl (ref 78.0–100.0)
Monocytes Absolute: 0.6 10*3/uL (ref 0.1–1.0)
Monocytes Relative: 9.3 % (ref 3.0–12.0)
Neutro Abs: 3.5 10*3/uL (ref 1.4–7.7)
Neutrophils Relative %: 58.3 % (ref 43.0–77.0)
Platelets: 226 10*3/uL (ref 150.0–400.0)
RBC: 4.79 Mil/uL (ref 4.22–5.81)
RDW: 13.5 % (ref 11.5–15.5)
WBC: 6.1 10*3/uL (ref 4.0–10.5)

## 2022-10-13 LAB — HEPATIC FUNCTION PANEL
ALT: 20 U/L (ref 0–53)
AST: 31 U/L (ref 0–37)
Albumin: 4.7 g/dL (ref 3.5–5.2)
Alkaline Phosphatase: 43 U/L (ref 39–117)
Bilirubin, Direct: 0.2 mg/dL (ref 0.0–0.3)
Total Bilirubin: 1 mg/dL (ref 0.2–1.2)
Total Protein: 7.1 g/dL (ref 6.0–8.3)

## 2022-10-13 LAB — LIPID PANEL
Cholesterol: 215 mg/dL — ABNORMAL HIGH (ref 0–200)
HDL: 71.8 mg/dL (ref >39.00)
LDL Cholesterol: 131 mg/dL — ABNORMAL HIGH (ref 0–99)
NonHDL: 143.03
Total CHOL/HDL Ratio: 3
Triglycerides: 58 mg/dL (ref 0.0–149.0)
VLDL: 11.6 mg/dL (ref 0.0–40.0)

## 2022-10-13 LAB — BASIC METABOLIC PANEL
BUN: 18 mg/dL (ref 6–23)
CO2: 26 mEq/L (ref 19–32)
Calcium: 9.5 mg/dL (ref 8.4–10.5)
Chloride: 102 mEq/L (ref 96–112)
Creatinine, Ser: 1.07 mg/dL (ref 0.40–1.50)
GFR: 73.02 mL/min (ref >60.00)
Glucose, Bld: 89 mg/dL (ref 70–99)
Potassium: 4.2 mEq/L (ref 3.5–5.1)
Sodium: 139 mEq/L (ref 135–145)

## 2022-10-13 LAB — PSA: PSA: 1.84 ng/mL (ref 0.10–4.00)

## 2022-10-13 LAB — HEMOGLOBIN A1C: Hgb A1c MFr Bld: 5.2 % (ref 4.6–6.5)

## 2022-10-13 LAB — TSH: TSH: 3.71 u[IU]/mL (ref 0.35–5.50)

## 2022-10-13 MED ORDER — LAMOTRIGINE 25 MG PO TABS: 25.0000 mg | ORAL_TABLET | Freq: Every day | ORAL | 2 refills | Status: DC

## 2022-10-13 NOTE — Progress Notes (Signed)
Subjective:    Patient ID: Christopher [name], male    DOB: 07/27/1956, 67 y.o.   MRN: [id number]  HPI Here to follow up on issues. His only concern today is mild anxiety and depression. He has tried several SSRI's, Wellbutrin, and most recently Remeron. Unfortunately all these gave him side effects so he stopped them. He has worked with therapists in the past, but he did not find this to be helpful. His BP has been stable. 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 They agreed to monitor these for now. His OA is stable. His ED is stable.    Review of Systems  Constitutional: Negative.   HENT: Negative.    Eyes: Negative.   Respiratory: Negative.    Cardiovascular: Negative.   Gastrointestinal: Negative.   Genitourinary: Negative.   Musculoskeletal: Negative.   Skin: Negative.   Neurological: Negative.   Psychiatric/Behavioral:  Positive for dysphoric mood. Negative for agitation, behavioral problems, confusion, decreased concentration, hallucinations, self-injury and suicidal ideas. The patient is nervous/anxious.        Objective:   Physical Exam Constitutional:      General: He is not in acute distress.    Appearance: Normal appearance. He is well-developed. He is not diaphoretic.  HENT:     Head: Normocephalic and atraumatic.     Right Ear: External ear normal.     Left Ear: External ear normal.     Nose: Nose normal.     Mouth/Throat:     Pharynx: No oropharyngeal exudate.  Eyes:     General: No scleral icterus.       Right eye: No discharge.        Left eye: No discharge.     Conjunctiva/sclera: Conjunctivae normal.     Pupils: Pupils are equal, round, and reactive to light.  Neck:     Thyroid: No thyromegaly.     Vascular: No JVD.     Trachea: No tracheal deviation.  Cardiovascular:     Rate and Rhythm: Normal rate and regular rhythm.     Pulses: Normal pulses.     Heart sounds: Normal heart sounds. No murmur  heard.    No friction rub. No gallop.  Pulmonary:     Effort: Pulmonary effort is normal. No respiratory distress.     Breath sounds: Normal breath sounds. No wheezing or rales.  Chest:     Chest wall: No tenderness.  Abdominal:     General: Bowel sounds are normal. There is no distension.     Palpations: Abdomen is soft. There is no mass.     Tenderness: There is no abdominal tenderness. There is no guarding or rebound.  Genitourinary:    Penis: Normal. No tenderness.      Testes: Normal.     Prostate: Normal.     Rectum: Normal. Guaiac result negative.  Musculoskeletal:        General: No tenderness. Normal range of motion.     Cervical back: Normal range of motion and neck supple.  Lymphadenopathy:     Cervical: No cervical adenopathy.  Skin:    General: Skin is warm and dry.     Coloration: Skin is not pale.     Findings: No erythema or rash.  Neurological:     Mental Status: He is alert and oriented to person, place, and time.     Cranial Nerves: No cranial nerve deficit.  Motor: No abnormal muscle tone.     Coordination: Coordination normal.     Deep Tendon Reflexes: Reflexes are normal and symmetric. Reflexes normal.  Psychiatric:        Mood and Affect: Mood normal.        Behavior: Behavior normal.        Thought Content: Thought content normal.        Judgment: Judgment normal.           Assessment & Plan:  His HTN and OA are stable. His hearing loss and tinnitus are mild, so he will foll ow up as needed for these issues. He has mild anxiety and depression, so he will try Lamictal 25 mg at bedtime. His ED is well controlled. We spent a total of ( 33  ) minutes reviewing records and discussing these issues.  Alysia Penna, MD

## 2022-10-13 NOTE — Addendum Note (Signed)
Addended by: Wyvonne Lenz on: 10/13/2022 02:27 PM   Modules accepted: Orders

## 2022-10-13 NOTE — Addendum Note (Signed)
Addended by: Westley Hummer B on: 10/13/2022 03:33 PM   Modules accepted: Orders

## 2022-10-13 NOTE — Addendum Note (Signed)
Addended by: Wyvonne Lenz on: 10/13/2022 02:23 PM   Modules accepted: Orders

## 2022-10-15 ENCOUNTER — Ambulatory Visit: Payer: Medicare PPO

## 2022-10-15 LAB — TB SKIN TEST
Induration: 3 mm
TB Skin Test: NEGATIVE

## 2022-10-15 NOTE — Progress Notes (Signed)
PPD Reading Note PPD read and results entered in [facility]. Result: 3 mm induration. Interpretation: Dr. Jerilee Hoh If test not read within 48-72 hours of initial placement, patient advised to repeat in other arm 1-3 weeks after this test. Allergic reaction: no

## 2022-11-13 ENCOUNTER — Encounter: Payer: Self-pay | Admitting: Family Medicine

## 2022-11-13 ENCOUNTER — Other Ambulatory Visit: Payer: Self-pay

## 2022-11-13 ENCOUNTER — Other Ambulatory Visit: Payer: Self-pay | Admitting: Family Medicine

## 2022-11-13 DIAGNOSIS — F5101 Primary insomnia: Secondary | ICD-10-CM

## 2022-11-13 MED ORDER — TRAZODONE HCL 50 MG PO TABS: ORAL_TABLET | ORAL | 2 refills | Status: DC

## 2022-11-13 NOTE — Telephone Encounter (Signed)
Last OV-10/13/22 Last refill-04/21/22--30 capsules, 5 refills  No future OV scheduled.

## 2022-11-13 NOTE — Telephone Encounter (Signed)
I do not believe he has tried Trazodone for sleep. This is safe and not habit forming. Call in Trazodone 50 mg at bedtime, #30 with 2 rf. He can experiment with taking 1 or 2 tabs at a time, then report back to [place].

## 2022-12-02 ENCOUNTER — Telehealth: Payer: Self-pay | Admitting: Family Medicine

## 2022-12-02 NOTE — Telephone Encounter (Signed)
Contacted Christopher [name] to schedule their annual wellness visit. Welcome to Medicare visit Due by 08/08/23.  Christopher [name] AWV direct phone # [phone]   Due WTM before 08/08/23 per [facility]

## 2022-12-18 DIAGNOSIS — M13842 Other specified arthritis, left hand: Secondary | ICD-10-CM | POA: Diagnosis not present

## 2022-12-18 DIAGNOSIS — M13841 Other specified arthritis, right hand: Secondary | ICD-10-CM | POA: Diagnosis not present

## 2022-12-21 DIAGNOSIS — L578 Other skin changes due to chronic exposure to nonionizing radiation: Secondary | ICD-10-CM | POA: Diagnosis not present

## 2022-12-21 DIAGNOSIS — L821 Other seborrheic keratosis: Secondary | ICD-10-CM | POA: Diagnosis not present

## 2022-12-21 DIAGNOSIS — L570 Actinic keratosis: Secondary | ICD-10-CM | POA: Diagnosis not present

## 2022-12-21 DIAGNOSIS — L814 Other melanin hyperpigmentation: Secondary | ICD-10-CM | POA: Diagnosis not present

## 2022-12-21 DIAGNOSIS — D485 Neoplasm of uncertain behavior of skin: Secondary | ICD-10-CM | POA: Diagnosis not present

## 2023-01-04 ENCOUNTER — Encounter: Payer: Self-pay | Admitting: Internal Medicine

## 2023-01-07 ENCOUNTER — Encounter: Payer: Self-pay | Admitting: Internal Medicine

## 2023-01-07 ENCOUNTER — Other Ambulatory Visit: Payer: Self-pay | Admitting: Family Medicine

## 2023-01-08 ENCOUNTER — Other Ambulatory Visit: Payer: Self-pay | Admitting: Family Medicine

## 2023-01-11 DIAGNOSIS — G4733 Obstructive sleep apnea (adult) (pediatric): Secondary | ICD-10-CM | POA: Diagnosis not present

## 2023-01-20 DIAGNOSIS — L570 Actinic keratosis: Secondary | ICD-10-CM | POA: Diagnosis not present

## 2023-01-20 DIAGNOSIS — L814 Other melanin hyperpigmentation: Secondary | ICD-10-CM | POA: Diagnosis not present

## 2023-01-20 DIAGNOSIS — L821 Other seborrheic keratosis: Secondary | ICD-10-CM | POA: Diagnosis not present

## 2023-01-20 DIAGNOSIS — D225 Melanocytic nevi of trunk: Secondary | ICD-10-CM | POA: Diagnosis not present

## 2023-01-20 DIAGNOSIS — D2230 Melanocytic nevi of unspecified part of face: Secondary | ICD-10-CM | POA: Diagnosis not present

## 2023-01-20 DIAGNOSIS — Z85828 Personal history of other malignant neoplasm of skin: Secondary | ICD-10-CM | POA: Diagnosis not present

## 2023-01-26 ENCOUNTER — Encounter: Payer: Self-pay | Admitting: Internal Medicine

## 2023-01-26 ENCOUNTER — Ambulatory Visit (AMBULATORY_SURGERY_CENTER): Payer: Medicare PPO

## 2023-01-26 VITALS — Ht 71.0 in | Wt 176.0 lb

## 2023-01-26 DIAGNOSIS — Z86010 Personal history of colonic polyps: Secondary | ICD-10-CM

## 2023-01-26 MED ORDER — PEG 3350-KCL-NA BICARB-NACL 420 G PO SOLR
4000.0000 mL | Freq: Once | ORAL | 0 refills | Status: AC
Start: 2023-01-26 — End: 2023-01-26

## 2023-01-26 NOTE — Progress Notes (Signed)
No egg or soy allergy known to patient  No issues known to pt with past sedation with any surgeries or procedures Patient denies ever being told they had issues or difficulty with intubation  No FH of Malignant Hyperthermia Pt is not on diet pills Pt is not on  home 02  Pt is not on blood thinners  Pt denies issues with constipation  No A fib or A flutter Have any cardiac testing pending--no Pt instructed to use [email or web address].[email or web address] or GoodRx for a price reduction on prep  Can ambulate with out assistance 

## 2023-02-15 ENCOUNTER — Ambulatory Visit (AMBULATORY_SURGERY_CENTER): Payer: Medicare PPO | Admitting: Internal Medicine

## 2023-02-15 ENCOUNTER — Encounter: Payer: Self-pay | Admitting: Internal Medicine

## 2023-02-15 VITALS — BP 137/76 | HR 59 | Temp 98.6°F | Resp 11 | Ht 71.0 in | Wt 176.0 lb

## 2023-02-15 DIAGNOSIS — D124 Benign neoplasm of descending colon: Secondary | ICD-10-CM

## 2023-02-15 DIAGNOSIS — Z86010 Personal history of colonic polyps: Secondary | ICD-10-CM | POA: Diagnosis not present

## 2023-02-15 DIAGNOSIS — Z09 Encounter for follow-up examination after completed treatment for conditions other than malignant neoplasm: Secondary | ICD-10-CM

## 2023-02-15 DIAGNOSIS — F32A Depression, unspecified: Secondary | ICD-10-CM | POA: Diagnosis not present

## 2023-02-15 DIAGNOSIS — G4730 Sleep apnea, unspecified: Secondary | ICD-10-CM | POA: Diagnosis not present

## 2023-02-15 DIAGNOSIS — I10 Essential (primary) hypertension: Secondary | ICD-10-CM | POA: Diagnosis not present

## 2023-02-15 DIAGNOSIS — Z1211 Encounter for screening for malignant neoplasm of colon: Secondary | ICD-10-CM | POA: Diagnosis not present

## 2023-02-15 HISTORY — PX: COLONOSCOPY: SHX174

## 2023-02-15 MED ORDER — SODIUM CHLORIDE 0.9 % IV SOLN
500.0000 mL | Freq: Once | INTRAVENOUS | Status: DC
Start: 2023-02-15 — End: 2023-02-15

## 2023-02-15 NOTE — Patient Instructions (Addendum)
Resume all of your previous medications today.  Read the paperwork given to you by your recovery room nurse.  YOU HAD AN ENDOSCOPIC PROCEDURE TODAY AT THE [facility]:   Refer to the procedure report that was given to you for any specific questions about what was found during the examination.  If the procedure report does not answer your questions, please call your gastroenterologist to clarify.  If you requested that your care partner not be given the details of your procedure findings, then the procedure report has been included in a sealed envelope for you to review at your convenience later.  YOU SHOULD EXPECT: Some feelings of bloating in the abdomen. Passage of more gas than usual.  Walking can help get rid of the air that was put into your GI tract during the procedure and reduce the bloating. If you had a lower endoscopy (such as a colonoscopy or flexible sigmoidoscopy) you may notice spotting of blood in your stool or on the toilet paper. If you underwent a bowel prep for your procedure, you may not have a normal bowel movement for a few days.  Please Note:  You might notice some irritation and congestion in your nose or some drainage.  This is from the oxygen used during your procedure.  There is no need for concern and it should clear up in a day or so.  SYMPTOMS TO REPORT IMMEDIATELY:  Following lower endoscopy (colonoscopy or flexible sigmoidoscopy):  Excessive amounts of blood in the stool  Significant tenderness or worsening of abdominal pains  Swelling of the abdomen that is new Fever over 100  For urgent or emergent issues, a gastroenterologist can be reached at any hour by calling [phone]. Do not use MyChart messaging for urgent concerns.    DIET:  We do recommend a small meal at first, but then you may proceed to your regular diet.  Drink plenty of fluids but you should avoid alcoholic beverages for 24 hours. Try to increase the fiber in your diet, and  drink plenty of water.  ACTIVITY:  You should plan to take it easy for the rest of today and you should NOT DRIVE or use heavy machinery until tomorrow (because of the sedation medicines used during the test).    FOLLOW UP: Our staff will call the number listed on your records the next business day following your procedure.  We will call around 7:15- 8:00 am to check on you and address any questions or concerns that you may have regarding the information given to you following your procedure. If we do not reach you, we will leave a message.     If any biopsies were taken you will be contacted by phone or by letter within the next 1-3 weeks.  Please call us at [phone] if you have not heard about the biopsies in 3 weeks.    SIGNATURES/CONFIDENTIALITY: You and/or your care partner have signed paperwork which will be entered into your electronic medical record.  These signatures attest to the fact that that the information above on your After Visit Summary has been reviewed and is understood.  Full responsibility of the confidentiality of this discharge information lies with you and/or your care-partner.

## 2023-02-15 NOTE — Progress Notes (Signed)
Sedate, gd SR, tolerated procedure well, VSS, report to RN 

## 2023-02-15 NOTE — Progress Notes (Signed)
HISTORY OF PRESENT ILLNESS:  Christopher [name] is a 67 y.o. male with a history of multiple adenomatous colon polyps who presents today for surveillance colonoscopy.  No complaints  REVIEW OF SYSTEMS:  All non-GI ROS negative except for  Past Medical History:  Diagnosis Date   Arthritis    Colon polyps 09/26/2012   TUBULAR ADENOMA (X1)   Depression    Hypertension    Insomnia    Sleep apnea     Past Surgical History:  Procedure Laterality Date   COLONOSCOPY  09-26-12   per Dr. Marina Goodell, adenomatous polyps, repeat in 5 yrs    COLONOSCOPY  01/17/2018   per Dr. Marina Goodell, adenomatous polyps, repeat in 5 yrs    EYE SURGERY     for glaucoma   KNEE ARTHROPLASTY Left 03/01/2019   Procedure: COMPUTER ASSISTED TOTAL KNEE ARTHROPLASTY;  Surgeon: Samson Frederic, MD;  Location: [facility];  Service: Orthopedics;  Laterality: Left;   REPLACEMENT TOTAL KNEE     right   ROTATOR CUFF REPAIR     right   ROTATOR CUFF REPAIR     left   TOE SURGERY  08/2018   Dr Victorino Dike    WISDOM TOOTH EXTRACTION      Social History Christopher [name]  reports that he has never smoked. He has never used smokeless tobacco. He reports current alcohol use of about 10.0 standard drinks of alcohol per week. He reports that he does not use drugs.  family history includes Colon cancer in his maternal aunt; Colon polyps in his maternal uncle; Gallbladder disease in his brother; Hypertension in his brother and father.  No Known Allergies     PHYSICAL EXAMINATION: Vital signs: BP (!) 158/85   Pulse (!) 57   Temp 98.6 F (37 C)   Ht 5\' 11"  (1.803 m)   Wt 176 lb (79.8 kg)   SpO2 97%   BMI 24.55 kg/m  General: Well-developed, well-nourished, no acute distress HEENT: Sclerae are anicteric, conjunctiva pink. Oral mucosa intact Lungs: Clear Heart: Regular Abdomen: soft, nontender, nondistended, no obvious ascites, no peritoneal signs, normal bowel sounds. No organomegaly. Extremities: No edema Psychiatric: alert and oriented  x3. Cooperative     ASSESSMENT:   Personal history of multiple adenomatous polyps  PLAN:  Surveillance colonoscopy

## 2023-02-15 NOTE — Progress Notes (Signed)
Called to room to assist during endoscopic procedure.  Patient ID and intended procedure confirmed with present staff. Received instructions for my participation in the procedure from the performing physician.  

## 2023-02-15 NOTE — Op Note (Signed)
[facility] Patient Name: Christopher [name] Procedure Date: 02/15/2023 7:19 AM MRN: [id number] Endoscopist: Wilhemina Bonito. Marina Goodell , MD, [id number] Age: 67 Referring MD:  Date of Birth: [birth year] Gender: Male Account #: [id number] Procedure:                Colonoscopy with cold snare polypectomy x 1 Indications:              High risk colon cancer surveillance: Personal                            history of multiple (3 or more) adenomas. Previous                            examinations 2005, 2014, 2019 Medicines:                Monitored Anesthesia Care Procedure:                Pre-Anesthesia Assessment:                           - Prior to the procedure, a History and Physical                            was performed, and patient medications and                            allergies were reviewed. The patient's tolerance of                            previous anesthesia was also reviewed. The risks                            and benefits of the procedure and the sedation                            options and risks were discussed with the patient.                            All questions were answered, and informed consent                            was obtained. Prior Anticoagulants: The patient has                            taken no anticoagulant or antiplatelet agents. ASA                            Grade Assessment: II - A patient with mild systemic                            disease. After reviewing the risks and benefits,                            the patient was deemed in satisfactory condition to  undergo the procedure.                           After obtaining informed consent, the colonoscope                            was passed under direct vision. Throughout the                            procedure, the patient's blood pressure, pulse, and                            oxygen saturations were monitored continuously. The                            CF  [id number] #[id number] was introduced through the anus                            and advanced to the the cecum, identified by                            appendiceal orifice and ileocecal valve. The                            ileocecal valve, appendiceal orifice, and rectum                            were photographed. The quality of the bowel                            preparation was good. The colonoscopy was performed                            without difficulty. The patient tolerated the                            procedure well. The bowel preparation used was                            SUPREP via split dose instruction. Scope In: 8:20:03 AM Scope Out: 8:37:34 AM Scope Withdrawal Time: 0 hours 13 minutes 19 seconds  Total Procedure Duration: 0 hours 17 minutes 31 seconds  Findings:                 A 3 mm polyp was found in the descending colon. The                            polyp was sessile. The polyp was removed with a                            cold snare. Resection and retrieval were complete.                           Diverticula were found in the sigmoid colon.  The exam was otherwise without abnormality on                            direct and retroflexion views. Complications:            No immediate complications. Estimated blood loss:                            None. Estimated Blood Loss:     Estimated blood loss: none. Impression:               - One 3 mm polyp in the descending colon, removed                            with a cold snare. Resected and retrieved.                           - Diverticulosis in the sigmoid colon.                           - The examination was otherwise normal on direct                            and retroflexion views. Recommendation:           - Repeat colonoscopy in 5 years for surveillance.                           - Patient has a contact number available for                            emergencies. The signs and  symptoms of potential                            delayed complications were discussed with the                            patient. Return to normal activities tomorrow.                            Written discharge instructions were provided to the                            patient.                           - Resume previous diet.                           - Continue present medications.                           - Await pathology results. Wilhemina Bonito. Marina Goodell, MD 02/15/2023 8:56:42 AM This report has been signed electronically.

## 2023-02-16 ENCOUNTER — Telehealth: Payer: Self-pay

## 2023-02-16 NOTE — Telephone Encounter (Signed)
  Follow up Call-     02/15/2023    7:30 AM  Call back number  Post procedure Call Back phone  # [phone]  Permission to leave phone message Yes     Patient questions:  Do you have a fever, pain , or abdominal swelling? No. Pain Score  0 *  Have you tolerated food without any problems? Yes.    Have you been able to return to your normal activities? Yes.    Do you have any questions about your discharge instructions: Diet   No. Medications  No. Follow up visit  No.  Do you have questions or concerns about your Care? No.  Actions: * If pain score is 4 or above: No action needed, pain <4.

## 2023-02-22 ENCOUNTER — Encounter: Payer: Self-pay | Admitting: Internal Medicine

## 2023-02-22 NOTE — Telephone Encounter (Signed)
Please refer him to Althea Grimmer, DDS, Orthodontist  to consider oral appliance for OSA

## 2023-02-23 ENCOUNTER — Other Ambulatory Visit: Payer: Self-pay

## 2023-02-23 DIAGNOSIS — G4733 Obstructive sleep apnea (adult) (pediatric): Secondary | ICD-10-CM

## 2023-03-01 ENCOUNTER — Ambulatory Visit: Payer: Medicare PPO | Admitting: Family Medicine

## 2023-03-01 ENCOUNTER — Encounter: Payer: Self-pay | Admitting: Family Medicine

## 2023-03-01 VITALS — BP 130/80 | Temp 98.2°F | Wt 184.0 lb

## 2023-03-01 DIAGNOSIS — I10 Essential (primary) hypertension: Secondary | ICD-10-CM

## 2023-03-01 MED ORDER — AMLODIPINE BESYLATE 2.5 MG PO TABS: 2.5000 mg | ORAL_TABLET | Freq: Every day | ORAL | 2 refills | Status: DC

## 2023-03-01 NOTE — Progress Notes (Signed)
   Subjective:    Patient ID: Christopher [name], male    DOB: 09-08-1955, 67 y.o.   MRN: [id number]  HPI Here with questions about his BP. He has tried several different HTN medications over the years, and most of these were stopped because they made him feel tired. Then for a few years his BP seemed to be stable all on its own. Now lately he has gotten some readings of 150/90 at times, at other times it is normal. He feels well.    Review of Systems  Constitutional: Negative.   Respiratory: Negative.    Cardiovascular: Negative.        Objective:   Physical Exam Constitutional:      Appearance: Normal appearance.  Cardiovascular:     Rate and Rhythm: Normal rate and regular rhythm.     Pulses: Normal pulses.     Heart sounds: Normal heart sounds.  Pulmonary:     Effort: Pulmonary effort is normal.     Breath sounds: Normal breath sounds.  Neurological:     Mental Status: He is alert.           Assessment & Plan:  Mild HTN. We will start him on Amlodipine 5 mg daily. He will report back in 3-4 weeks. Gershon Crane, MD

## 2023-03-22 DIAGNOSIS — M25812 Other specified joint disorders, left shoulder: Secondary | ICD-10-CM | POA: Diagnosis not present

## 2023-03-22 DIAGNOSIS — H401131 Primary open-angle glaucoma, bilateral, mild stage: Secondary | ICD-10-CM | POA: Diagnosis not present

## 2023-04-05 DIAGNOSIS — M67961 Unspecified disorder of synovium and tendon, right lower leg: Secondary | ICD-10-CM | POA: Diagnosis not present

## 2023-04-05 DIAGNOSIS — M7661 Achilles tendinitis, right leg: Secondary | ICD-10-CM | POA: Diagnosis not present

## 2023-04-06 DIAGNOSIS — M7661 Achilles tendinitis, right leg: Secondary | ICD-10-CM | POA: Diagnosis not present

## 2023-05-04 DIAGNOSIS — G4733 Obstructive sleep apnea (adult) (pediatric): Secondary | ICD-10-CM | POA: Diagnosis not present

## 2023-05-16 ENCOUNTER — Other Ambulatory Visit: Payer: Self-pay | Admitting: Family Medicine

## 2023-05-18 NOTE — Telephone Encounter (Signed)
Pt LOV was on 03/01/23 Last refill was done on 11/13/22 Please advise

## 2023-05-23 ENCOUNTER — Other Ambulatory Visit: Payer: Self-pay | Admitting: Family Medicine

## 2023-05-24 NOTE — Telephone Encounter (Signed)
Pt LOV was on 03/01/23 Last refill was done on 06/11/22 Please advise

## 2023-05-26 ENCOUNTER — Encounter: Payer: Self-pay | Admitting: Family Medicine

## 2023-05-26 MED ORDER — METHYLPREDNISOLONE 4 MG PO TBPK: ORAL_TABLET | ORAL | 0 refills | Status: DC

## 2023-05-26 NOTE — Telephone Encounter (Signed)
I sent in a Medrol dose pack

## 2023-06-01 DIAGNOSIS — M545 Low back pain, unspecified: Secondary | ICD-10-CM | POA: Diagnosis not present

## 2023-06-14 DIAGNOSIS — D3122 Benign neoplasm of left retina: Secondary | ICD-10-CM | POA: Diagnosis not present

## 2023-06-14 DIAGNOSIS — H524 Presbyopia: Secondary | ICD-10-CM | POA: Diagnosis not present

## 2023-06-14 DIAGNOSIS — H401132 Primary open-angle glaucoma, bilateral, moderate stage: Secondary | ICD-10-CM | POA: Diagnosis not present

## 2023-06-17 DIAGNOSIS — M545 Low back pain, unspecified: Secondary | ICD-10-CM | POA: Diagnosis not present

## 2023-06-21 ENCOUNTER — Other Ambulatory Visit: Payer: Self-pay | Admitting: Family Medicine

## 2023-07-01 DIAGNOSIS — M5416 Radiculopathy, lumbar region: Secondary | ICD-10-CM | POA: Diagnosis not present

## 2023-07-13 DIAGNOSIS — H04123 Dry eye syndrome of bilateral lacrimal glands: Secondary | ICD-10-CM | POA: Diagnosis not present

## 2023-07-13 DIAGNOSIS — M545 Low back pain, unspecified: Secondary | ICD-10-CM | POA: Diagnosis not present

## 2023-07-15 DIAGNOSIS — M545 Low back pain, unspecified: Secondary | ICD-10-CM | POA: Diagnosis not present

## 2023-07-19 DIAGNOSIS — M5416 Radiculopathy, lumbar region: Secondary | ICD-10-CM | POA: Diagnosis not present

## 2023-07-21 ENCOUNTER — Other Ambulatory Visit: Payer: Self-pay | Admitting: Family Medicine

## 2023-07-22 DIAGNOSIS — M5416 Radiculopathy, lumbar region: Secondary | ICD-10-CM | POA: Diagnosis not present

## 2023-07-22 DIAGNOSIS — M545 Low back pain, unspecified: Secondary | ICD-10-CM | POA: Diagnosis not present

## 2023-07-22 NOTE — Telephone Encounter (Signed)
 Last OV 03/01/23

## 2023-07-27 DIAGNOSIS — M5451 Vertebrogenic low back pain: Secondary | ICD-10-CM | POA: Diagnosis not present

## 2023-08-17 DIAGNOSIS — M5416 Radiculopathy, lumbar region: Secondary | ICD-10-CM | POA: Diagnosis not present

## 2023-08-31 DIAGNOSIS — M51369 Other intervertebral disc degeneration, lumbar region without mention of lumbar back pain or lower extremity pain: Secondary | ICD-10-CM | POA: Diagnosis not present

## 2023-08-31 DIAGNOSIS — M5451 Vertebrogenic low back pain: Secondary | ICD-10-CM | POA: Diagnosis not present

## 2023-09-09 DIAGNOSIS — M180 Bilateral primary osteoarthritis of first carpometacarpal joints: Secondary | ICD-10-CM | POA: Diagnosis not present

## 2023-09-14 DIAGNOSIS — M5416 Radiculopathy, lumbar region: Secondary | ICD-10-CM | POA: Diagnosis not present

## 2023-09-17 ENCOUNTER — Telehealth: Payer: Self-pay | Admitting: Internal Medicine

## 2023-09-17 DIAGNOSIS — G4733 Obstructive sleep apnea (adult) (pediatric): Secondary | ICD-10-CM

## 2023-09-17 NOTE — Telephone Encounter (Signed)
 Pt needs to know which supplies he needs to be order; pt needs guidance on how to get supplies

## 2023-09-20 NOTE — Telephone Encounter (Signed)
 Attempted to call patient. No answer. Also LMOM

## 2023-09-21 ENCOUNTER — Telehealth: Payer: Self-pay | Admitting: Internal Medicine

## 2023-09-21 NOTE — Telephone Encounter (Signed)
 Last seen 2023. If he needs updated visit, then ok to set up with me or an APP.

## 2023-09-21 NOTE — Telephone Encounter (Signed)
 Hello Christopher [name],   I am in need of updated face to face notes showing usage and benfit (Both). I do not show patient has been seen in office for pap since 2023.

## 2023-09-23 NOTE — Telephone Encounter (Signed)
I think it is his insurance making visit rules for the home care company. If he doesn't wish to make an appointment, he can discuss with his home care company, and he is free to order supplies on-line.

## 2023-09-23 NOTE — Telephone Encounter (Signed)
Patient would like to order cpap supplies from [employer]. In order to do so he will need his CPAP prescription to be sen tot the company. Please call and advise [phone]

## 2023-09-28 NOTE — Telephone Encounter (Signed)
Pt is still scheduled with Dr Maple Hudson on 10-04-23. NFN

## 2023-09-30 ENCOUNTER — Encounter: Payer: Self-pay | Admitting: Internal Medicine

## 2023-10-03 ENCOUNTER — Other Ambulatory Visit: Payer: Self-pay | Admitting: Family Medicine

## 2023-10-03 NOTE — Progress Notes (Unsigned)
HPI M never smoker followed for OSA, complicated by HTN, Hypogonadism, ETOH, Anemia, Anxiety, Glaucoma, Insomnia,  HST 11/27/21- AHI 32.7/ hr, desaturation to 74%, body weight 175 lbs  ======================================================================================   03/26/22- 65 yoM never smoker followed for OSA, complicated by HTN, Hypogonadism, ETOH, Anemia, Anxiety, Glaucoma, Insomnia,  HST 11/27/21- AHI 32.7/ hr, desaturation to 74%, body weight 175 lbs CPAP auto 5-20/ [facility]- ordered 12/03/21     AirSense 10 AutoSet Download compliance-90%, AHI 2.7/ hr Body weight today-184 lbs ------Pt f/u, pt doing well with nasal pillows. Apprx. 7-8hrs of sleep a night.  Download reviewed.  Good start on CPAP.  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 "Not as comfortable as CPAP". He would like to change DME for CPAP to [employer]. Discussed the use of AI scribe software for clinical note transcription with the patient, who gave verbal consent to proceed. Arrival BP 161/101 today. History of Present Illness   The patient, with a known history of sleep apnea, reports using a CPAP machine for management. He expresses satisfaction with the machine, noting that it prevents snoring and improves sleep quality. However, he also mentions occasional difficulty sleeping and waking up at early hours. The patient has tried an oral device for times when he cannot use the CPAP machine, such as during camping trips. While the oral device is less comfortable than the nasal pillows of the CPAP machine, it seems to work. The patient also reports some nasal snoring when using the oral device.  The  patient mentions a concern about high blood pressure, which he has previously managed with medication. He expresses a desire to address this issue with his PCP due to recent readings indicating elevated blood pressure.     ROS-see HPI   + = positive Constitutional:    weight loss, night sweats, fevers, chills, fatigue, lassitude. HEENT:    headaches, difficulty swallowing, tooth/dental problems, sore throat,       sneezing, itching, ear ache, nasal congestion, post nasal drip, snoring CV:    chest pain, orthopnea, PND, swelling in lower extremities, anasarca,                                   dizziness, palpitations Resp:   shortness of breath with exertion or at rest.                productive cough,   non-productive cough, coughing up of blood.              change in color of mucus.  wheezing.   Skin:    rash or lesions. GI:  No-   heartburn, indigestion, abdominal pain, nausea, vomiting, diarrhea,                 change in bowel habits, loss of appetite GU: dysuria, change in color of urine, no urgency or frequency.   flank pain. MS:   joint pain, stiffness, decreased range of motion, back pain. Neuro-     nothing unusual Psych:  change in mood or affect.  depression or anxiety.   memory loss.  OBJ- Physical Exam General- Alert, Oriented, Affect-appropriate, Distress- none  acute, + trim Skin- rash-none, lesions- none, excoriation- none Lymphadenopathy- none Head- atraumatic            Eyes- Gross vision intact, PERRLA, conjunctivae and secretions clear            Ears- Hearing, canals-normal            Nose- Clear, no-Septal dev, mucus, polyps, erosion, perforation             Throat- Mallampati III , mucosa clear , drainage- none, tonsils- atrophic, + teeth Neck- flexible , trachea midline, no stridor , thyroid nl, carotid no bruit Chest - symmetrical excursion , unlabored           Heart/CV- RRR , no murmur , no gallop  , no rub, nl s1 s2                           - JVD- none ,  edema- none, stasis changes- none, varices- none           Lung- clear to P&A, wheeze- none, cough- none , dullness-none, rub- none           Chest wall-  Abd-  Br/ Gen/ Rectal- Not done, not indicated Extrem- cyanosis- none, clubbing, none, atrophy- none, strength- nl Neuro- grossly intact to observation Assessment and Plan    Obstructive Sleep Apnea Patient is compliant with CPAP therapy and reports improvement in snoring. He uses an oral device when unable to use CPAP. He reports some daytime tiredness, but it is not significantly impacting his daily activities. The CPAP machine data shows a breakthrough apnea index of 2 per hour, which is within the acceptable range. -Continue CPAP therapy as tolerated. -Encourage regular cleaning of CPAP equipment to prevent bacterial growth. -Check CPAP machine data annually to ensure effective treatment. -He asks to change DME to [facility]  Hypertension Patient reports a history of hypertension and previous use of beta blockers, which he discontinued due to side effects. He is currently not on any antihypertensive medication. -Refer to primary care physician or cardiologist for further evaluation and management of hypertension.  General Health Maintenance -Consider replacement of CPAP machine after 5 years, as per insurance policy. -Schedule annual follow-up appointments to monitor sleep apnea management.

## 2023-10-04 ENCOUNTER — Ambulatory Visit: Payer: Medicare PPO | Admitting: Internal Medicine

## 2023-10-04 ENCOUNTER — Encounter: Payer: Self-pay | Admitting: Internal Medicine

## 2023-10-04 VITALS — BP 161/101 | HR 54 | Ht 72.0 in | Wt 184.4 lb

## 2023-10-04 DIAGNOSIS — I10 Essential (primary) hypertension: Secondary | ICD-10-CM | POA: Diagnosis not present

## 2023-10-04 DIAGNOSIS — G4733 Obstructive sleep apnea (adult) (pediatric): Secondary | ICD-10-CM

## 2023-10-04 NOTE — Patient Instructions (Signed)
Order- Change DME from [facility] to [facility], continue current CPAP auto 5-20, mask of choice, humidifier, supplies, airView card  Please call if we can help

## 2023-10-05 ENCOUNTER — Ambulatory Visit: Payer: Medicare PPO | Admitting: Family Medicine

## 2023-10-05 ENCOUNTER — Encounter: Payer: Self-pay | Admitting: Family Medicine

## 2023-10-05 VITALS — HR 61 | Temp 98.1°F | Wt 184.0 lb

## 2023-10-05 DIAGNOSIS — I10 Essential (primary) hypertension: Secondary | ICD-10-CM | POA: Diagnosis not present

## 2023-10-05 DIAGNOSIS — M5416 Radiculopathy, lumbar region: Secondary | ICD-10-CM | POA: Diagnosis not present

## 2023-10-05 DIAGNOSIS — M4317 Spondylolisthesis, lumbosacral region: Secondary | ICD-10-CM | POA: Diagnosis not present

## 2023-10-05 DIAGNOSIS — M549 Dorsalgia, unspecified: Secondary | ICD-10-CM | POA: Diagnosis not present

## 2023-10-05 MED ORDER — LAMOTRIGINE 25 MG PO TABS: 25.0000 mg | ORAL_TABLET | Freq: Every day | ORAL | 3 refills | Status: AC

## 2023-10-05 NOTE — Telephone Encounter (Signed)
Seen in office today, 1/28 by Dr. Maple Hudson.  Any issues addressed during OV.  Nothing further needed.  Closing encounter.

## 2023-10-05 NOTE — Progress Notes (Signed)
   Subjective:    Patient ID: Christopher [name], male    DOB: 1956/01/13, 68 y.o.   MRN: [id number]  HPI Here for high BP readings. He stopped taking BP medication several years ago because his pressures normalized with diet and exercise. He has felt fine, no SOB or headaches, etc. Then yesterday he was seen at Dr. Roxy Cedar office at 4 in the afternoon, and his BP was 161/101. This alarmed him, so he came in today. We checked the BP several times, and it was always normal. After questioning him today, he says he had a cup of caffeinated coffee one hour before that appointment, and he rarely drinks coffee in the afternoons.    Review of Systems  Constitutional: Negative.   Respiratory: Negative.    Cardiovascular: Negative.   Neurological: Negative.        Objective:   Physical Exam Constitutional:      Appearance: Normal appearance.  Cardiovascular:     Rate and Rhythm: Normal rate and regular rhythm.     Pulses: Normal pulses.     Heart sounds: Normal heart sounds.  Pulmonary:     Effort: Pulmonary effort is normal.     Breath sounds: Normal breath sounds.  Musculoskeletal:     Right lower leg: No edema.     Left lower leg: No edema.  Neurological:     Mental Status: He is alert.           Assessment & Plan:  His BP is fine today. We determined that the high reading yesterday was the result of the coffee he drank. We will continue to monitor this. He will return next week for a well exam and labs.  Gershon Crane, MD

## 2023-10-19 ENCOUNTER — Encounter: Payer: Self-pay | Admitting: Internal Medicine

## 2023-10-28 ENCOUNTER — Telehealth: Payer: Self-pay | Admitting: Pharmacy Technician

## 2023-10-28 NOTE — Telephone Encounter (Signed)
Pharmacy Patient Advocate Encounter   Received notification from CoverMyMeds that prior authorization for Testosterone Cypionate 200MG /ML intramuscular solution is required/requested.   Insurance verification completed.   The patient is insured through [employer] .   Per test claim: PA required; PA submitted to above mentioned insurance via CoverMyMeds Key/confirmation #/EOC [id number] Status is pending

## 2023-10-29 NOTE — Telephone Encounter (Signed)
Pharmacy Patient Advocate Encounter  Received notification from [facility] that Prior Authorization for Testosterone Cypionate 200MG /ML intramuscular solution has been APPROVED from 09/08/23 to 09/06/24. Spoke to pharmacy to process.Copay is $24.00.    PA #/Case ID/Reference #: [id number]

## 2023-11-01 ENCOUNTER — Encounter: Payer: Self-pay | Admitting: Family Medicine

## 2023-11-01 ENCOUNTER — Ambulatory Visit (INDEPENDENT_AMBULATORY_CARE_PROVIDER_SITE_OTHER): Payer: Medicare PPO | Admitting: Family Medicine

## 2023-11-01 VITALS — BP 124/72 | HR 63 | Temp 97.7°F | Ht 72.0 in | Wt 182.0 lb

## 2023-11-01 DIAGNOSIS — F418 Other specified anxiety disorders: Secondary | ICD-10-CM | POA: Diagnosis not present

## 2023-11-01 DIAGNOSIS — R739 Hyperglycemia, unspecified: Secondary | ICD-10-CM | POA: Diagnosis not present

## 2023-11-01 DIAGNOSIS — F5101 Primary insomnia: Secondary | ICD-10-CM

## 2023-11-01 DIAGNOSIS — D649 Anemia, unspecified: Secondary | ICD-10-CM

## 2023-11-01 DIAGNOSIS — E291 Testicular hypofunction: Secondary | ICD-10-CM | POA: Diagnosis not present

## 2023-11-01 DIAGNOSIS — I10 Essential (primary) hypertension: Secondary | ICD-10-CM

## 2023-11-01 DIAGNOSIS — N529 Male erectile dysfunction, unspecified: Secondary | ICD-10-CM

## 2023-11-01 LAB — CBC WITH DIFFERENTIAL/PLATELET
Basophils Absolute: 0 10*3/uL (ref 0.0–0.1)
Basophils Relative: 1 % (ref 0.0–3.0)
Eosinophils Absolute: 0.1 10*3/uL (ref 0.0–0.7)
Eosinophils Relative: 1.4 % (ref 0.0–5.0)
HCT: 49.2 % (ref 39.0–52.0)
Hemoglobin: 16.4 g/dL (ref 13.0–17.0)
Lymphocytes Relative: 27.6 % (ref 12.0–46.0)
Lymphs Abs: 1.3 10*3/uL (ref 0.7–4.0)
MCHC: 33.4 g/dL (ref 30.0–36.0)
MCV: 99.5 fL (ref 78.0–100.0)
Monocytes Absolute: 0.5 10*3/uL (ref 0.1–1.0)
Monocytes Relative: 9.8 % (ref 3.0–12.0)
Neutro Abs: 2.9 10*3/uL (ref 1.4–7.7)
Neutrophils Relative %: 60.2 % (ref 43.0–77.0)
Platelets: 223 10*3/uL (ref 150.0–400.0)
RBC: 4.94 Mil/uL (ref 4.22–5.81)
RDW: 13.7 % (ref 11.5–15.5)
WBC: 4.7 10*3/uL (ref 4.0–10.5)

## 2023-11-01 LAB — HEPATIC FUNCTION PANEL
ALT: 16 U/L (ref 0–53)
AST: 25 U/L (ref 0–37)
Albumin: 4.5 g/dL (ref 3.5–5.2)
Alkaline Phosphatase: 38 U/L — ABNORMAL LOW (ref 39–117)
Bilirubin, Direct: 0.2 mg/dL (ref 0.0–0.3)
Total Bilirubin: 1 mg/dL (ref 0.2–1.2)
Total Protein: 6.9 g/dL (ref 6.0–8.3)

## 2023-11-01 LAB — LIPID PANEL
Cholesterol: 219 mg/dL — ABNORMAL HIGH (ref 0–200)
HDL: 67.9 mg/dL (ref >39.00)
LDL Cholesterol: 141 mg/dL — ABNORMAL HIGH (ref 0–99)
NonHDL: 151.4
Total CHOL/HDL Ratio: 3
Triglycerides: 51 mg/dL (ref 0.0–149.0)
VLDL: 10.2 mg/dL (ref 0.0–40.0)

## 2023-11-01 LAB — HEMOGLOBIN A1C: Hgb A1c MFr Bld: 5.3 % (ref 4.6–6.5)

## 2023-11-01 LAB — BASIC METABOLIC PANEL
BUN: 15 mg/dL (ref 6–23)
CO2: 28 meq/L (ref 19–32)
Calcium: 9.6 mg/dL (ref 8.4–10.5)
Chloride: 103 meq/L (ref 96–112)
Creatinine, Ser: 1.08 mg/dL (ref 0.40–1.50)
GFR: 71.68 mL/min (ref >60.00)
Glucose, Bld: 94 mg/dL (ref 70–99)
Potassium: 4.6 meq/L (ref 3.5–5.1)
Sodium: 140 meq/L (ref 135–145)

## 2023-11-01 LAB — PSA: PSA: 1.87 ng/mL (ref 0.10–4.00)

## 2023-11-01 LAB — TESTOSTERONE: Testosterone: 1118.7 ng/dL — ABNORMAL HIGH (ref 300.00–890.00)

## 2023-11-01 LAB — TSH: TSH: 2.37 u[IU]/mL (ref 0.35–5.50)

## 2023-11-01 MED ORDER — TESTOSTERONE CYPIONATE 200 MG/ML IM SOLN: 200.0000 mg | INTRAMUSCULAR | Status: DC

## 2023-11-01 NOTE — Progress Notes (Signed)
 Subjective:    Patient ID: Christopher [name], male    DOB: 01/04/1956, 68 y.o.   MRN: [id number]  HPI Here to follow up on issues. He has no complaints today. His BP has been fine at home. He sleeps 5-6 hours with the Temazepam. His depression and anxiety are stable. Hehas been using testosterone shots every 14 days, and he has been happy with this schedule. He feels strong during his workouts and the ED has not been a problem.    Review of Systems  Constitutional: Negative.   HENT: Negative.    Eyes: Negative.   Respiratory: Negative.    Cardiovascular: Negative.   Gastrointestinal: Negative.   Genitourinary: Negative.   Musculoskeletal: Negative.   Skin: Negative.   Neurological: Negative.   Psychiatric/Behavioral: Negative.         Objective:   Physical Exam Constitutional:      General: He is not in acute distress.    Appearance: Normal appearance. He is well-developed. He is not diaphoretic.  HENT:     Head: Normocephalic and atraumatic.     Right Ear: External ear normal.     Left Ear: External ear normal.     Nose: Nose normal.     Mouth/Throat:     Pharynx: No oropharyngeal exudate.  Eyes:     General: No scleral icterus.       Right eye: No discharge.        Left eye: No discharge.     Conjunctiva/sclera: Conjunctivae normal.     Pupils: Pupils are equal, round, and reactive to light.  Neck:     Thyroid: No thyromegaly.     Vascular: No JVD.     Trachea: No tracheal deviation.  Cardiovascular:     Rate and Rhythm: Normal rate and regular rhythm.     Pulses: Normal pulses.     Heart sounds: Normal heart sounds. No murmur heard.    No friction rub. No gallop.  Pulmonary:     Effort: Pulmonary effort is normal. No respiratory distress.     Breath sounds: Normal breath sounds. No wheezing or rales.  Chest:     Chest wall: No tenderness.  Abdominal:     General: Bowel sounds are normal. There is no distension.     Palpations: Abdomen is soft. There is no  mass.     Tenderness: There is no abdominal tenderness. There is no guarding or rebound.  Genitourinary:    Penis: Normal. No tenderness.      Testes: Normal.     Prostate: Normal.     Rectum: Normal. Guaiac result negative.  Musculoskeletal:        General: No tenderness. Normal range of motion.     Cervical back: Neck supple.  Lymphadenopathy:     Cervical: No cervical adenopathy.  Skin:    General: Skin is warm and dry.     Coloration: Skin is not pale.     Findings: No erythema or rash.  Neurological:     General: No focal deficit present.     Mental Status: He is alert and oriented to person, place, and time.     Cranial Nerves: No cranial nerve deficit.     Motor: No abnormal muscle tone.     Coordination: Coordination normal.     Deep Tendon Reflexes: Reflexes are normal and symmetric. Reflexes normal.  Psychiatric:        Mood and Affect: Mood normal.  Behavior: Behavior normal.        Thought Content: Thought content normal.        Judgment: Judgment normal.           Assessment & Plan:  His depression and anxiety, as well as the insomnia, are well controlled. His hypogandism and ED are stable. His  HTN is stable. Get fasting labs to check lipids, testosterone, PSA, etc. We spent a total of ( 32  ) minutes reviewing records and discussing these issues.  Gershon Crane, MD

## 2023-11-29 ENCOUNTER — Other Ambulatory Visit: Payer: Self-pay | Admitting: Family Medicine

## 2023-11-30 NOTE — Telephone Encounter (Signed)
 Pt LOV was on 10/10/23 Last refill was done on 05/18/23 Please advise

## 2024-01-10 DIAGNOSIS — H401131 Primary open-angle glaucoma, bilateral, mild stage: Secondary | ICD-10-CM | POA: Diagnosis not present

## 2024-01-11 ENCOUNTER — Other Ambulatory Visit: Payer: Self-pay | Admitting: Family Medicine

## 2024-01-18 DIAGNOSIS — D225 Melanocytic nevi of trunk: Secondary | ICD-10-CM | POA: Diagnosis not present

## 2024-01-18 DIAGNOSIS — L814 Other melanin hyperpigmentation: Secondary | ICD-10-CM | POA: Diagnosis not present

## 2024-01-18 DIAGNOSIS — Z85828 Personal history of other malignant neoplasm of skin: Secondary | ICD-10-CM | POA: Diagnosis not present

## 2024-01-18 DIAGNOSIS — D239 Other benign neoplasm of skin, unspecified: Secondary | ICD-10-CM | POA: Diagnosis not present

## 2024-01-18 DIAGNOSIS — L821 Other seborrheic keratosis: Secondary | ICD-10-CM | POA: Diagnosis not present

## 2024-01-18 DIAGNOSIS — D1801 Hemangioma of skin and subcutaneous tissue: Secondary | ICD-10-CM | POA: Diagnosis not present

## 2024-01-18 DIAGNOSIS — D2230 Melanocytic nevi of unspecified part of face: Secondary | ICD-10-CM | POA: Diagnosis not present

## 2024-01-25 DIAGNOSIS — M180 Bilateral primary osteoarthritis of first carpometacarpal joints: Secondary | ICD-10-CM | POA: Diagnosis not present

## 2024-02-13 ENCOUNTER — Other Ambulatory Visit: Payer: Self-pay | Admitting: Family Medicine

## 2024-02-14 NOTE — Telephone Encounter (Signed)
 Pt LOV was on 11/01/23 Last refill was done on 07/23/23 Please advise

## 2024-04-19 DIAGNOSIS — M25812 Other specified joint disorders, left shoulder: Secondary | ICD-10-CM | POA: Diagnosis not present

## 2024-05-09 ENCOUNTER — Encounter: Payer: Self-pay | Admitting: Family Medicine

## 2024-05-09 MED ORDER — DOXYCYCLINE HYCLATE 100 MG PO TABS: 100.0000 mg | ORAL_TABLET | Freq: Two times a day (BID) | ORAL | 0 refills | Status: AC

## 2024-05-09 NOTE — Telephone Encounter (Signed)
 I would go ahead and treat this. I sent in a 10 day supply of Doxycycline 

## 2024-06-16 ENCOUNTER — Other Ambulatory Visit: Payer: Self-pay | Admitting: Family Medicine

## 2024-06-20 DIAGNOSIS — H401131 Primary open-angle glaucoma, bilateral, mild stage: Secondary | ICD-10-CM | POA: Diagnosis not present

## 2024-06-20 DIAGNOSIS — H02882 Meibomian gland dysfunction right lower eyelid: Secondary | ICD-10-CM | POA: Diagnosis not present

## 2024-06-20 DIAGNOSIS — H02885 Meibomian gland dysfunction left lower eyelid: Secondary | ICD-10-CM | POA: Diagnosis not present

## 2024-07-04 ENCOUNTER — Other Ambulatory Visit: Payer: Self-pay | Admitting: Family Medicine

## 2024-07-05 DIAGNOSIS — M13832 Other specified arthritis, left wrist: Secondary | ICD-10-CM | POA: Diagnosis not present

## 2024-07-05 DIAGNOSIS — M1812 Unilateral primary osteoarthritis of first carpometacarpal joint, left hand: Secondary | ICD-10-CM | POA: Diagnosis not present

## 2024-07-18 DIAGNOSIS — M13842 Other specified arthritis, left hand: Secondary | ICD-10-CM | POA: Diagnosis not present

## 2024-07-18 DIAGNOSIS — M1812 Unilateral primary osteoarthritis of first carpometacarpal joint, left hand: Secondary | ICD-10-CM | POA: Diagnosis not present

## 2024-07-24 ENCOUNTER — Encounter: Payer: Self-pay | Admitting: Family Medicine

## 2024-07-26 NOTE — Telephone Encounter (Signed)
 There is no quick acting antidepressant medication. By the time it kicks in in 3-4 weeks, he won't need it anymore.

## 2024-08-01 DIAGNOSIS — M13842 Other specified arthritis, left hand: Secondary | ICD-10-CM | POA: Diagnosis not present

## 2024-08-10 DIAGNOSIS — M79642 Pain in left hand: Secondary | ICD-10-CM | POA: Diagnosis not present

## 2024-08-10 DIAGNOSIS — M25642 Stiffness of left hand, not elsewhere classified: Secondary | ICD-10-CM | POA: Diagnosis not present

## 2024-08-15 DIAGNOSIS — M79642 Pain in left hand: Secondary | ICD-10-CM | POA: Diagnosis not present

## 2024-08-15 DIAGNOSIS — M25642 Stiffness of left hand, not elsewhere classified: Secondary | ICD-10-CM | POA: Diagnosis not present

## 2024-08-19 ENCOUNTER — Other Ambulatory Visit: Payer: Self-pay | Admitting: Family Medicine

## 2024-08-21 NOTE — Telephone Encounter (Signed)
 Medication: Lorazepam  Directions:  TAKE 1 TABLET BY MOUTH TWICE A DAY AS NEEDED  TAKE 1 TABLET BY MOUTH TWICE A DAY AS NEEDED   Last given: 02/14/2024 Number refills: 5 Last o/v:  Follow up:  Labs:   Please review refill request.
